# Patient Record
Sex: Female | Born: 1950 | Race: White | Hispanic: No | Marital: Married | Smoking: Never smoker
Health system: Southern US, Community
[De-identification: ages and names within clinical notes are randomized; demographics above are authoritative.]

## PROBLEM LIST (undated history)

## (undated) DIAGNOSIS — K589 Irritable bowel syndrome without diarrhea: Secondary | ICD-10-CM

## (undated) DIAGNOSIS — C50919 Malignant neoplasm of unspecified site of unspecified female breast: Secondary | ICD-10-CM

## (undated) DIAGNOSIS — E7800 Pure hypercholesterolemia, unspecified: Secondary | ICD-10-CM

## (undated) DIAGNOSIS — J449 Chronic obstructive pulmonary disease, unspecified: Secondary | ICD-10-CM

---

## 2018-12-19 ENCOUNTER — Other Ambulatory Visit: Payer: Self-pay

## 2018-12-19 ENCOUNTER — Emergency Department: Payer: Medicare Other

## 2018-12-19 ENCOUNTER — Inpatient Hospital Stay
Admission: EM | Admit: 2018-12-19 | Discharge: 2018-12-21 | DRG: 066 | Disposition: A | Discharge status: Home / Self Care | Payer: Medicare Other | Attending: Internal Medicine | Admitting: Internal Medicine

## 2018-12-19 DIAGNOSIS — I700 Atherosclerosis of aorta: Secondary | ICD-10-CM | POA: Diagnosis present

## 2018-12-19 DIAGNOSIS — R42 Dizziness and giddiness: Secondary | ICD-10-CM

## 2018-12-19 DIAGNOSIS — Z7951 Long term (current) use of inhaled steroids: Secondary | ICD-10-CM

## 2018-12-19 DIAGNOSIS — E785 Hyperlipidemia, unspecified: Secondary | ICD-10-CM | POA: Diagnosis present

## 2018-12-19 DIAGNOSIS — I10 Essential (primary) hypertension: Secondary | ICD-10-CM | POA: Diagnosis present

## 2018-12-19 DIAGNOSIS — Z79899 Other long term (current) drug therapy: Secondary | ICD-10-CM

## 2018-12-19 DIAGNOSIS — I639 Cerebral infarction, unspecified: Secondary | ICD-10-CM | POA: Diagnosis not present

## 2018-12-19 DIAGNOSIS — E7800 Pure hypercholesterolemia, unspecified: Secondary | ICD-10-CM | POA: Diagnosis present

## 2018-12-19 DIAGNOSIS — R29700 NIHSS score 0: Secondary | ICD-10-CM | POA: Diagnosis present

## 2018-12-19 DIAGNOSIS — I63411 Cerebral infarction due to embolism of right middle cerebral artery: Principal | ICD-10-CM | POA: Diagnosis present

## 2018-12-19 DIAGNOSIS — Z823 Family history of stroke: Secondary | ICD-10-CM

## 2018-12-19 DIAGNOSIS — K589 Irritable bowel syndrome without diarrhea: Secondary | ICD-10-CM | POA: Diagnosis present

## 2018-12-19 DIAGNOSIS — J449 Chronic obstructive pulmonary disease, unspecified: Secondary | ICD-10-CM | POA: Diagnosis present

## 2018-12-19 HISTORY — DX: Chronic obstructive pulmonary disease, unspecified: J44.9

## 2018-12-19 HISTORY — DX: Irritable bowel syndrome, unspecified: K58.9

## 2018-12-19 HISTORY — DX: Pure hypercholesterolemia, unspecified: E78.00

## 2018-12-19 LAB — BASIC METABOLIC PANEL
Anion gap: 10 (ref 5–15)
BUN: 20 mg/dL (ref 8–23)
CALCIUM: 9.3 mg/dL (ref 8.9–10.3)
CO2: 24 mmol/L (ref 22–32)
Chloride: 105 mmol/L (ref 98–111)
Creatinine, Ser: 1.22 mg/dL — ABNORMAL HIGH (ref 0.44–1.00)
GFR calc Af Amer: 54 mL/min — ABNORMAL LOW (ref >60)
GFR, EST NON AFRICAN AMERICAN: 46 mL/min — AB (ref >60)
Glucose, Bld: 94 mg/dL (ref 70–99)
Potassium: 4 mmol/L (ref 3.5–5.1)
Sodium: 139 mmol/L (ref 135–145)

## 2018-12-19 LAB — HEMOGLOBIN A1C
Hgb A1c MFr Bld: 5.9 % — ABNORMAL HIGH (ref 4.8–5.6)
Mean Plasma Glucose: 122.63 mg/dL

## 2018-12-19 LAB — CBC
HCT: 41.2 % (ref 36.0–46.0)
Hemoglobin: 13.2 g/dL (ref 12.0–15.0)
MCH: 27.8 pg (ref 26.0–34.0)
MCHC: 32 g/dL (ref 30.0–36.0)
MCV: 86.9 fL (ref 80.0–100.0)
PLATELETS: 261 10*3/uL (ref 150–400)
RBC: 4.74 MIL/uL (ref 3.87–5.11)
RDW: 13.9 % (ref 11.5–15.5)
WBC: 9.9 10*3/uL (ref 4.0–10.5)
nRBC: 0 % (ref 0.0–0.2)

## 2018-12-19 LAB — LIPID PANEL
Cholesterol: 177 mg/dL (ref 0–200)
HDL: 66 mg/dL (ref >40)
LDL Cholesterol: 97 mg/dL (ref 0–99)
TRIGLYCERIDES: 71 mg/dL (ref <150)
Total CHOL/HDL Ratio: 2.7 RATIO
VLDL: 14 mg/dL (ref 0–40)

## 2018-12-19 MED ORDER — LORAZEPAM 2 MG/ML IJ SOLN
1.0000 mg | Freq: Once | INTRAMUSCULAR | Status: AC
  Administered 2018-12-20: 1 mg via INTRAVENOUS
  Filled 2018-12-19 (×2): qty 1

## 2018-12-19 MED ORDER — VITAMIN D 25 MCG (1000 UNIT) PO TABS
2000.0000 [IU] | ORAL_TABLET | Freq: Every day | ORAL | Status: DC
  Administered 2018-12-19 – 2018-12-21 (×3): 2000 [IU] via ORAL
  Filled 2018-12-19 (×3): qty 2

## 2018-12-19 MED ORDER — HEPARIN SODIUM (PORCINE) 5000 UNIT/ML IJ SOLN
5000.0000 [IU] | Freq: Three times a day (TID) | INTRAMUSCULAR | Status: DC
  Administered 2018-12-19 – 2018-12-20 (×4): 5000 [IU] via SUBCUTANEOUS
  Filled 2018-12-19 (×4): qty 1

## 2018-12-19 MED ORDER — ACETAMINOPHEN 325 MG PO TABS
650.0000 mg | ORAL_TABLET | ORAL | Status: DC | PRN
  Administered 2018-12-19 – 2018-12-20 (×4): 650 mg via ORAL
  Filled 2018-12-19 (×4): qty 2

## 2018-12-19 MED ORDER — ACETAMINOPHEN 160 MG/5ML PO SOLN
650.0000 mg | ORAL | Status: DC | PRN
  Filled 2018-12-19: qty 20.3

## 2018-12-19 MED ORDER — FLUTICASONE PROPIONATE 50 MCG/ACT NA SUSP
1.0000 | Freq: Every day | NASAL | Status: DC
  Administered 2018-12-19 – 2018-12-20 (×2): 2 via NASAL
  Administered 2018-12-21: 11:00:00 1 via NASAL
  Filled 2018-12-19: qty 16

## 2018-12-19 MED ORDER — SENNOSIDES-DOCUSATE SODIUM 8.6-50 MG PO TABS: 1.0000 | ORAL_TABLET | Freq: Every evening | ORAL | Status: DC | PRN

## 2018-12-19 MED ORDER — MONTELUKAST SODIUM 10 MG PO TABS
10.0000 mg | ORAL_TABLET | Freq: Every evening | ORAL | Status: DC
  Administered 2018-12-19 – 2018-12-20 (×2): 10 mg via ORAL
  Filled 2018-12-19 (×2): qty 1

## 2018-12-19 MED ORDER — CALCIUM CARBONATE ANTACID 500 MG PO CHEW
600.0000 mg | CHEWABLE_TABLET | Freq: Every day | ORAL | Status: DC
  Administered 2018-12-20 – 2018-12-21 (×2): 600 mg via ORAL
  Filled 2018-12-19 (×2): qty 3

## 2018-12-19 MED ORDER — SIMVASTATIN 20 MG PO TABS
20.0000 mg | ORAL_TABLET | Freq: Every evening | ORAL | Status: DC
  Administered 2018-12-19 – 2018-12-20 (×2): 20 mg via ORAL
  Filled 2018-12-19 (×2): qty 1

## 2018-12-19 MED ORDER — STROKE: EARLY STAGES OF RECOVERY BOOK
Freq: Once | Status: AC
  Administered 2018-12-20

## 2018-12-19 MED ORDER — PANTOPRAZOLE SODIUM 40 MG PO TBEC
40.0000 mg | DELAYED_RELEASE_TABLET | Freq: Every day | ORAL | Status: DC
  Administered 2018-12-20 – 2018-12-21 (×2): 40 mg via ORAL
  Filled 2018-12-19 (×2): qty 1

## 2018-12-19 MED ORDER — SODIUM CHLORIDE 0.9 % IV SOLN
INTRAVENOUS | Status: DC
  Administered 2018-12-20 – 2018-12-21 (×3): via INTRAVENOUS

## 2018-12-19 MED ORDER — MOMETASONE FURO-FORMOTEROL FUM 100-5 MCG/ACT IN AERO
2.0000 | INHALATION_SPRAY | Freq: Two times a day (BID) | RESPIRATORY_TRACT | Status: DC
  Administered 2018-12-19 – 2018-12-20 (×3): 2 via RESPIRATORY_TRACT
  Filled 2018-12-19: qty 8.8

## 2018-12-19 MED ORDER — ACETAMINOPHEN 650 MG RE SUPP: 650.0000 mg | RECTAL | Status: DC | PRN

## 2018-12-19 MED ORDER — ASPIRIN 81 MG PO CHEW
81.0000 mg | CHEWABLE_TABLET | Freq: Every day | ORAL | Status: DC
  Administered 2018-12-20 – 2018-12-21 (×2): 81 mg via ORAL
  Filled 2018-12-19 (×2): qty 1

## 2018-12-19 MED ORDER — TIOTROPIUM BROMIDE MONOHYDRATE 18 MCG IN CAPS
18.0000 ug | ORAL_CAPSULE | Freq: Every day | RESPIRATORY_TRACT | Status: DC
  Administered 2018-12-20: 18 ug via RESPIRATORY_TRACT
  Filled 2018-12-19: qty 5

## 2018-12-19 NOTE — ED Triage Notes (Signed)
To ER via POV c/o headache X 2 days that have continued throughout today. Pt was forgetful on how to get dressed yesterday, dizziness that continues today. Pt does not appear to be confused or have loss of balance at this time. Equal strength in all extremities. denies vision changes over last 2 days.

## 2018-12-19 NOTE — ED Notes (Signed)
Patient sitting up in bed eating food from outside bought in by hr family.

## 2018-12-19 NOTE — ED Notes (Signed)
Report given to to receiving nurse Catia RN

## 2018-12-19 NOTE — ED Notes (Signed)
Reports headache 2 days ago, has not resolved. Patients labs and ct done while waiting to be bought back to room. NIH scale 0, patient passed swallow eval. Vss. Po meds given as ordered. S/o at bedside. Call light within reach. Will continue to monitor.

## 2018-12-19 NOTE — Progress Notes (Signed)
Family Meeting Note  Advance Directive:yes  Today a meeting took place with the Patient and spouse.   The following clinical team members were present during this meeting:MD  The following were discussed:Patient's diagnosis: Stroke, Patient's progosis: Unable to determine and Goals for treatment: Full Code  Additional follow-up to be provided: Neurology  Time spent during discussion:20 minutes  Vaughan Basta, MD

## 2018-12-19 NOTE — H&P (Signed)
[facility] at [place] NAME: Megan Hancock    MR#:  [id number]  DATE OF BIRTH:  [birth year]  DATE OF ADMISSION:  12/19/2018  PRIMARY CARE PHYSICIAN: Patient, No Pcp Per   REQUESTING/REFERRING PHYSICIAN: Archie Balboa  CHIEF COMPLAINT:   Chief Complaint  Patient presents with  . Headache  . Dizziness    HISTORY OF PRESENT ILLNESS: Megan Hancock  is a 68 y.o. female with a known history of COPD, hypercholesterolemia-lives in [place] and comes to [place] to the second home every winter from November to March. Taking all her medications regularly.  For last 2 days she had headache with some dizziness and funny feeling or incoordination in her movements.  Yesterday she also had some vision problem for very short time but it recovered right away. Concerned with this she came to hospital today and noted to have acute stroke on CT scan of the head.  PAST MEDICAL HISTORY:   Past Medical History:  Diagnosis Date  . COPD (chronic obstructive pulmonary disease) ([place])   . Hypercholesteremia     PAST SURGICAL HISTORY: History reviewed. No pertinent surgical history.  SOCIAL HISTORY:  Social History   Tobacco Use  . Smoking status: Never Smoker  Substance Use Topics  . Alcohol use: Not Currently    FAMILY HISTORY:  Family History  Problem Relation Age of Onset  . Stroke Daughter     DRUG ALLERGIES: No Known Allergies  REVIEW OF SYSTEMS:   CONSTITUTIONAL: No fever, fatigue or weakness.  EYES: No blurred or double vision.  EARS, NOSE, AND THROAT: No tinnitus or ear pain.  RESPIRATORY: No cough, shortness of breath, wheezing or hemoptysis.  CARDIOVASCULAR: No chest pain, orthopnea, edema.  GASTROINTESTINAL: No nausea, vomiting, diarrhea or abdominal pain.  GENITOURINARY: No dysuria, hematuria.  ENDOCRINE: No polyuria, nocturia,  HEMATOLOGY: No anemia, easy bruising or bleeding SKIN: No rash or lesion. MUSCULOSKELETAL: No  joint pain or arthritis.   NEUROLOGIC: No tingling, numbness, weakness.  PSYCHIATRY: No anxiety or depression.   MEDICATIONS AT HOME:  Prior to Admission medications   Medication Sig Start Date End Date Taking? Authorizing Provider  calcium carbonate (OSCAL) 1500 (600 Ca) MG TABS tablet Take 600 mg of elemental calcium by mouth daily.   Yes [provider]  Cholecalciferol (VITAMIN D) 50 MCG (2000 UT) tablet Take 2,000 Units by mouth daily.   Yes [provider]  Fiber POWD Take 5 g by mouth daily.   Yes [provider]  fluticasone (FLONASE) 50 MCG/ACT nasal spray Place 1-2 sprays into both nostrils daily.   Yes [provider]  mometasone (NASONEX) 50 MCG/ACT nasal spray Place 2 sprays into the nose daily as needed (nasal congestion).   Yes [provider]  mometasone-formoterol (DULERA) 100-5 MCG/ACT AERO Inhale 2 puffs into the lungs 2 (two) times daily.   Yes [provider]  montelukast (SINGULAIR) 10 MG tablet Take 10 mg by mouth every evening.   Yes [provider]  omeprazole (PRILOSEC) 40 MG capsule Take 40 mg by mouth daily.   Yes [provider]  simvastatin (ZOCOR) 20 MG tablet Take 20 mg by mouth every evening.   Yes [provider]  tiotropium (SPIRIVA) 18 MCG inhalation capsule Place 18 mcg into inhaler and inhale daily.   Yes [provider]      PHYSICAL EXAMINATION:   VITAL SIGNS: Blood pressure (!) 150/93, pulse 89, temperature 98 F (36.7 C), temperature source Oral,  resp. rate 14, height 5\' 8"  (1.727 m), weight 82.6 kg, SpO2 99 %.  GENERAL:  68 y.o.-year-old patient lying in the bed with no acute distress.  EYES: Pupils equal, round, reactive to light and accommodation. No scleral icterus. Extraocular muscles intact.  HEENT: Head atraumatic, normocephalic. Oropharynx and nasopharynx clear.  NECK:  Supple, no jugular venous distention. No thyroid enlargement, no tenderness.   LUNGS: Normal breath sounds bilaterally, no wheezing, rales,rhonchi or crepitation. No use of accessory muscles of respiration.  CARDIOVASCULAR: S1, S2 normal. No murmurs, rubs, or gallops.  ABDOMEN: Soft, nontender, nondistended. Bowel sounds present. No organomegaly or mass.  EXTREMITIES: No pedal edema, cyanosis, or clubbing.  NEUROLOGIC: Cranial nerves II through XII are intact. Muscle strength 5/5 in all extremities. Sensation intact. Gait not checked.  PSYCHIATRIC: The patient is alert and oriented x 3.  SKIN: No obvious rash, lesion, or ulcer.   LABORATORY PANEL:   CBC Recent Labs  Lab 12/19/18 1441  WBC 9.9  HGB 13.2  HCT 41.2  PLT 261  MCV 86.9  MCH 27.8  MCHC 32.0  RDW 13.9   ------------------------------------------------------------------------------------------------------------------  Chemistries  Recent Labs  Lab 12/19/18 1441  NA 139  K 4.0  CL 105  CO2 24  GLUCOSE 94  BUN 20  CREATININE 1.22*  CALCIUM 9.3   ------------------------------------------------------------------------------------------------------------------ estimated creatinine clearance is 51.8 mL/min (A) (by C-G formula based on SCr of 1.22 mg/dL (H)). ------------------------------------------------------------------------------------------------------------------ No results for input(s): TSH, T4TOTAL, T3FREE, THYROIDAB in the last 72 hours.  Invalid input(s): FREET3   Coagulation profile No results for input(s): INR, PROTIME in the last 168 hours. ------------------------------------------------------------------------------------------------------------------- No results for input(s): DDIMER in the last 72 hours. -------------------------------------------------------------------------------------------------------------------  Cardiac Enzymes No results for input(s): CKMB, TROPONINI, MYOGLOBIN in the last 168 hours.  Invalid input(s):  CK ------------------------------------------------------------------------------------------------------------------ Invalid input(s): POCBNP  ---------------------------------------------------------------------------------------------------------------  Urinalysis No results found for: COLORURINE, APPEARANCEUR, LABSPEC, PHURINE, GLUCOSEU, HGBUR, BILIRUBINUR, KETONESUR, PROTEINUR, UROBILINOGEN, NITRITE, LEUKOCYTESUR   RADIOLOGY: Ct Head Wo Contrast  Result Date: 12/19/2018 CLINICAL DATA:  Right-sided headache for 2 days with word-finding difficulty. EXAM: CT HEAD WITHOUT CONTRAST TECHNIQUE: Contiguous axial images were obtained from the base of the skull through the vertex without intravenous contrast. COMPARISON:  None. FINDINGS: Brain: There is a small acute to subacute cortical and subcortical infarct in the right parietal lobe. No intracranial hemorrhage, mass, midline shift, or extra-axial fluid collection is identified. The ventricles are normal. Bilateral cerebral white matter hypodensities are nonspecific but compatible with mild chronic small vessel ischemic disease. Vascular: Mild calcified atherosclerosis at the skull base. No hyperdense vessel. Skull: Right retromastoid craniotomy. Sinuses/Orbits: Visualized paranasal sinuses and mastoid air cells are clear. Bilateral cataract extraction. Other: None. IMPRESSION: 1. Small acute to subacute right parietal infarct. 2. Mild chronic small vessel ischemic disease. Electronically Signed   By: Logan Bores M.D.   On: 12/19/2018 15:24    EKG: Orders placed or performed during the hospital encounter of 12/19/18  . ED EKG  . ED EKG  . EKG 12-Lead  . EKG 12-Lead    IMPRESSION AND PLAN:  *Acute stroke Admitted for stroke work-up now MRI and MRA of brain. Carotid Doppler studies, echocardiogram. Check hemoglobin A1c and lipid panel. Frequent neuro checks. Her symptoms started 2 days ago so not a candidate for any interventions. We  will get PT OT and neurology evaluation. She was already taking statin, I will add aspirin for now.  *Permissive hypertension Her blood pressure is slightly higher currently but I  would allow it in this range and if it persist then we may have to start on small dose hypertensive medication.  *COPD Currently no exacerbation symptoms, continue home inhalers.  *Hyperlipidemia Continue statin.  All the records are reviewed and case discussed with ED provider. Management plans discussed with the patient, family and they are in agreement.  CODE STATUS: Full code.  Patient's husband was present in the room during my visit.  TOTAL TIME TAKING CARE OF THIS PATIENT: 45 minutes.    Vaughan Basta M.D on 12/19/2018   Between 7am to 6pm - Pager - [phone]  After 6pm go to [email or web address] - password [email or web address] [facility]  Office  [phone]  CC: Primary care physician; Patient, No Pcp Per   Note: This dictation was prepared with Dragon dictation along with smaller phrase technology. Any transcriptional errors that result from this process are unintentional.

## 2018-12-19 NOTE — ED Provider Notes (Signed)
[facility] Emergency Department Provider Note   ____________________________________________   I have reviewed the triage vital signs and the nursing notes.   HISTORY  Chief Complaint Headache and Dizziness   History limited by: Not Limited   HPI Megan Hancock is a 68 y.o. female who presents to the emergency department today because of concern for headache, dizziness and discoordination. The patient first noticed her symptoms upon awakening two days ago. Since then the headache has been persistent. The patient has noticed that she has been dizzy since then. She has also noticed discoordination, for example when trying to dress herself. The patient denies any change in vision or speech. Denies any focal weakness but noticed some tingling in the right hand. Denies similar symptoms in the past. Denies any trauma to her head.    Per medical record review patient has a history of COPD, HLD  Past Medical History:  Diagnosis Date  . COPD (chronic obstructive pulmonary disease) ([place])   . Hypercholesteremia     There are no active problems to display for this patient.   History reviewed. No pertinent surgical history.  Prior to Admission medications   Not on File    Allergies Patient has no known allergies.  No family history on file.  Social History Social History   Tobacco Use  . Smoking status: Not on file  Substance Use Topics  . Alcohol use: Not Currently  . Drug use: Not on file    Review of Systems Constitutional: No fever/chills Eyes: No visual changes. ENT: No sore throat. Cardiovascular: Denies chest pain. Respiratory: Denies shortness of breath. Gastrointestinal: No abdominal pain.  No nausea, no vomiting.  No diarrhea.   Genitourinary: Negative for dysuria. Musculoskeletal: Negative for back pain. Skin: Negative for rash. Neurological: Positive for headache and  discoordination  ____________________________________________   PHYSICAL EXAM:  VITAL SIGNS: ED Triage Vitals  Enc Vitals Group     BP 12/19/18 1427 (!) 150/93     Pulse Rate 12/19/18 1427 89     Resp 12/19/18 1427 14     Temp 12/19/18 1427 98 F (36.7 C)     Temp Source 12/19/18 1427 Oral     SpO2 12/19/18 1427 99 %     Weight 12/19/18 1427 182 lb (82.6 kg)     Height 12/19/18 1427 5\' 8"  (1.727 m)     Head Circumference --      Peak Flow --      Pain Score 12/19/18 1432 6   Constitutional: Alert and oriented.  Eyes: Conjunctivae are normal.  ENT      Head: Normocephalic and atraumatic.      Nose: No congestion/rhinnorhea.      Mouth/Throat: Mucous membranes are moist.      Neck: No stridor. Hematological/Lymphatic/Immunilogical: No cervical lymphadenopathy. Cardiovascular: Normal rate, regular rhythm.  No murmurs, rubs, or gallops.  Respiratory: Normal respiratory effort without tachypnea nor retractions. Breath sounds are clear and equal bilaterally. No wheezes/rales/rhonchi. Gastrointestinal: Soft and non tender. No rebound. No guarding.  Genitourinary: Deferred Musculoskeletal: Normal range of motion in all extremities. No lower extremity edema. Neurologic:  Normal speech and language. No gross focal neurologic deficits are appreciated.  Skin:  Skin is warm, dry and intact. No rash noted. Psychiatric: Mood and affect are normal. Speech and behavior are normal. Patient exhibits appropriate insight and judgment.  ____________________________________________    LABS (pertinent positives/negatives)  CBC wbc 9.9, hgb 13.2, plt 261 BMP wnl except cr 1.22 ____________________________________________  EKG  I, Nance Pear, attending physician, personally viewed and interpreted this EKG  EKG Time: 1438 Rate: 78 Rhythm: normal sinus rhythm Axis: normal axis Intervals: qtc 401 QRS: narrow ST changes: no st elevation Impression: possible left atrial  enlargement otherwise normal ekg   ____________________________________________    RADIOLOGY  CT head Small acute, subacute right parietal infarct  ____________________________________________   PROCEDURES  Procedures  ____________________________________________   INITIAL IMPRESSION / ASSESSMENT AND PLAN / ED COURSE  Pertinent labs & imaging results that were available during my care of the patient were reviewed by me and considered in my medical decision making (see chart for details).   Patient presented to the emergency department today because of concerns for 2-day history of headache, dizziness and discoordination.  CT scan was concerning for acute versus subacute parietal infarct.  Patient will be admitted to the hospital service for further stroke work-up.  ____________________________________________   FINAL CLINICAL IMPRESSION(S) / ED DIAGNOSES  Final diagnoses:  Dizziness  Cerebrovascular accident (CVA), unspecified mechanism ([place])     Note: This dictation was prepared with Dragon dictation. Any transcriptional errors that result from this process are unintentional     Nance Pear, MD 12/19/18 1743

## 2018-12-19 NOTE — ED Notes (Signed)
First nurse note: pt referred to ED by [facility] for R-sided headache x2 days. Staff report pt has had periodic difficulty finding words x2 dyas and occasionally forgets how to do things she knows how to do (eg. Getting dressed, misplacing things often). Pt appears in NAD, ambulatory to triage with steady gait. No facial droop or slurred speech noted at stat desk.

## 2018-12-20 ENCOUNTER — Inpatient Hospital Stay: Payer: Medicare Other

## 2018-12-20 ENCOUNTER — Inpatient Hospital Stay
Admit: 2018-12-20 | Discharge: 2018-12-20 | Disposition: A | Discharge status: Home / Self Care | Payer: Medicare Other | Attending: Internal Medicine | Admitting: Internal Medicine

## 2018-12-20 DIAGNOSIS — I639 Cerebral infarction, unspecified: Secondary | ICD-10-CM

## 2018-12-20 LAB — URINALYSIS, COMPLETE (UACMP) WITH MICROSCOPIC
Bilirubin Urine: NEGATIVE
Glucose, UA: NEGATIVE mg/dL
Hgb urine dipstick: NEGATIVE
Ketones, ur: NEGATIVE mg/dL
Leukocytes,Ua: NEGATIVE
Nitrite: NEGATIVE
Protein, ur: NEGATIVE mg/dL
SPECIFIC GRAVITY, URINE: 1.006 (ref 1.005–1.030)
pH: 6 (ref 5.0–8.0)

## 2018-12-20 LAB — ECHOCARDIOGRAM COMPLETE
Height: 68 in
WEIGHTICAEL: 2962.98 [oz_av]

## 2018-12-20 NOTE — Plan of Care (Signed)

## 2018-12-20 NOTE — Progress Notes (Signed)
SLP Cancellation Note  Patient Details Name: Megan Hancock MRN: [id number] DOB: October 03, 1951   Cancelled treatment:       Reason Eval/Treat Not Completed: SLP screened, no needs identified, will sign off, Chart reviewed. Pt and husband interviewed at length. No apparent evidence of receptive/expressive aphasia, dysarthria, cognitive linguistic deficits at bedside per screen. Spontaneous speech is fluent, 100% intelligible and composed of grammatically complex sentences. Pt and husband deny any s/s aspiration with current Regular diet with thin liquids. No current SLP needs identified, SLP to sign off at this time, please re-consult with any future change in status requiring reassessment. Thank you for this consult.   Chevelle Durr, MA, CCC-SLP 12/20/2018, 10:59 AM

## 2018-12-20 NOTE — Consult Note (Addendum)
Referring Physician: Bettey Costa, MD    Chief Complaint: right sided headache, dizziness.  Left-sided weakness and numbness.  HPI: Megan Hancock is an 68 y.o. female with past medical history of COPD, IBS, and hyperlipidemia presenting to the ED on 12/19/2018 with complaints of headache, dizziness and left-sided weakness and numbness. She reports onset of symptoms since 12/17/2018, per patient's husband who provides collateral history patient woke up the morning of 12/17/2018 with difficulty coordinating her left arm.  Patient reports she was trying to put on a robe but could not get her left arm to "work normally".  Her symptoms resolved that day and she was able to go about her daily activities without any issues.  However the next day on 12/18/2018 she developed headache on her right side that progressively increased in frequency, intensity and severity the next day 12/19/2018. Headache was intermittent, moderate to severe in intensity, located predominantly on the right side with associated dizziness. Denied associated altered sensorium, speech abnormality, cranial nerve deficit, seizures, diplopia, nausea or vomiting, syncope or LOC.  Patient is originally from [place] so she decided to call her PCP there who advised her to go to urgent care for further evaluation. Due to symptoms concerning for stroke, urgent care sent her to the ED for further evaluation. On arrival to the ED, blood pressure 150/93 mm Hg and pulse rate 89 beats/min. There were no focal neurological deficits; she was alert and oriented x4, and did not demonstrate any memory deficits.  Initial NIH stroke scale 0.  A non-contrast head CT showed an acute/subacute infarction in the right parietal.  An MRI of the head confirmed right parietal and insular/MCA territory infarct with small bilateral occipital acute versus subacute infarct compatible with embolic phenomena; MRA of the head was unremarkable.  Patient was admitted for further  stroke work-up and management.  Date last known well: Date: 12/17/2018 Time last known well: Unable to determine tPA Given: No: outside time window   Past Medical History:  Diagnosis Date  . COPD (chronic obstructive pulmonary disease) ([place])   . Hypercholesteremia   . IBS (irritable bowel syndrome)     History reviewed. No pertinent surgical history.  Family History  Problem Relation Age of Onset  . Stroke Daughter    Social History:  reports that she has never smoked. She has never used smokeless tobacco. She reports previous alcohol use. No history on file for drug.  Allergies: No Known Allergies  Medications:  I have reviewed the patient's current medications. Prior to Admission:  Medications Prior to Admission  Medication Sig Dispense Refill Last Dose  . calcium carbonate (OSCAL) 1500 (600 Ca) MG TABS tablet Take 600 mg of elemental calcium by mouth daily.   12/19/2018 at 0800  . Cholecalciferol (VITAMIN D) 50 MCG (2000 UT) tablet Take 2,000 Units by mouth daily.   12/18/2018 at Unknown time  . Fiber POWD Take 5 g by mouth daily.   12/18/2018 at Unknown time  . fluticasone (FLONASE) 50 MCG/ACT nasal spray Place 1-2 sprays into both nostrils daily.   12/18/2018 at Unknown time  . mometasone (NASONEX) 50 MCG/ACT nasal spray Place 2 sprays into the nose daily as needed (nasal congestion).   Unknown at PRN  . mometasone-formoterol (DULERA) 100-5 MCG/ACT AERO Inhale 2 puffs into the lungs 2 (two) times daily.   12/19/2018 at 0800  . montelukast (SINGULAIR) 10 MG tablet Take 10 mg by mouth every evening.   12/18/2018 at 2000  . omeprazole (PRILOSEC) 40 MG  capsule Take 40 mg by mouth daily.   12/19/2018 at 0800  . simvastatin (ZOCOR) 20 MG tablet Take 20 mg by mouth every evening.   12/18/2018 at 2000  . tiotropium (SPIRIVA) 18 MCG inhalation capsule Place 18 mcg into inhaler and inhale daily.   12/19/2018 at 0800   Scheduled: . aspirin  81 mg Oral Daily  . calcium carbonate  600 mg of  elemental calcium Oral Daily  . cholecalciferol  2,000 Units Oral Daily  . fluticasone  1-2 spray Each Nare Daily  . heparin  5,000 Units Subcutaneous Q8H  . mometasone-formoterol  2 puff Inhalation BID  . montelukast  10 mg Oral QPM  . pantoprazole  40 mg Oral Daily  . simvastatin  20 mg Oral QPM  . tiotropium  18 mcg Inhalation Daily    ROS: History obtained from the patient   General ROS: negative for - chills, fatigue, fever, night sweats, weight gain or weight loss Psychological ROS: negative for - behavioral disorder, hallucinations, memory difficulties, mood swings or suicidal ideation Ophthalmic ROS: negative for - blurry vision, double vision, eye pain or loss of vision ENT ROS: negative for - epistaxis, nasal discharge, oral lesions, sore throat, tinnitus or vertigo Allergy and Immunology ROS: negative for - hives or itchy/watery eyes Hematological and Lymphatic ROS: negative for - bleeding problems, bruising or swollen lymph nodes Endocrine ROS: negative for - galactorrhea, hair pattern changes, polydipsia/polyuria or temperature intolerance Respiratory ROS: negative for - cough, hemoptysis, shortness of breath or wheezing Cardiovascular ROS: negative for - chest pain, dyspnea on exertion, edema or irregular heartbeat Gastrointestinal ROS: negative for - abdominal pain, diarrhea, hematemesis, nausea/vomiting or stool incontinence Genito-Urinary ROS: negative for - dysuria, hematuria, incontinence or urinary frequency/urgency Musculoskeletal ROS: negative for - joint swelling or muscular weakness Neurological ROS: as noted in HPI Dermatological ROS: negative for rash and skin lesion changes  Physical Examination: Blood pressure (!) 152/79, pulse 98, temperature (!) 97.4 F (36.3 C), temperature source Oral, resp. rate 18, height 5\' 8"  (1.727 m), weight 84 kg, SpO2 98 %.   HEENT-  Normocephalic, no lesions, without obvious abnormality.  Normal external eye and conjunctiva.   Normal TM's bilaterally.  Normal auditory canals and external ears. Normal external nose, mucus membranes and septum.  Normal pharynx. Cardiovascular- S1, S2 normal, pulses palpable throughout   Lungs- chest clear, no wheezing, rales, normal symmetric air entry Abdomen- soft, non-tender; bowel sounds normal; no masses,  no organomegaly Extremities- no edema Lymph-no adenopathy palpable Musculoskeletal-no joint tenderness, deformity or swelling Skin-warm and dry, no hyperpigmentation, vitiligo, or suspicious lesions  Neurological Exam   Mental Status: Alert, oriented, thought content appropriate.  Speech fluent without evidence of aphasia.  Able to follow 3 step commands without difficulty. Attention span and concentration seemed appropriate  Cranial Nerves: II: Discs flat bilaterally; Visual fields grossly normal, pupils equal, round, reactive to light and accommodation III,IV, VI: ptosis present in the left, extra-ocular motions intact bilaterally V,VII: smile symmetric, facial light touch sensation decreased in the left VIII: hearing normal bilaterally IX,X: gag reflex present XI: bilateral shoulder shrug XII: midline tongue extension Motor: Right :  Upper extremity   5/5 Without pronator drift      Left: Upper extremity   4+/5 without pronator drift Right:   Lower extremity   5/5  Left: Lower extremity   4+/5 Tone and bulk:normal tone throughout; no atrophy noted Sensory: Pinprick and light touch decreased in the left Deep Tendon Reflexes: 2+ and symmetric throughout Plantars: Right: downgoing                            Left: downgoing Cerebellar: Finger-to-nose testing intact bilaterally. Heel to shin testing normal bilaterally Gait: not tested due to safety concerns  Data Reviewed  Laboratory Studies:  Basic Metabolic Panel: Recent Labs  Lab 12/19/18 1441  NA 139  K 4.0  CL 105  CO2 24  GLUCOSE 94  BUN 20  CREATININE 1.22*   CALCIUM 9.3    Liver Function Tests: No results for input(s): AST, ALT, ALKPHOS, BILITOT, PROT, ALBUMIN in the last 168 hours. No results for input(s): LIPASE, AMYLASE in the last 168 hours. No results for input(s): AMMONIA in the last 168 hours.  CBC: Recent Labs  Lab 12/19/18 1441  WBC 9.9  HGB 13.2  HCT 41.2  MCV 86.9  PLT 261    Cardiac Enzymes: No results for input(s): CKTOTAL, CKMB, CKMBINDEX, TROPONINI in the last 168 hours.  BNP: Invalid input(s): POCBNP  CBG: No results for input(s): GLUCAP in the last 168 hours.  Microbiology: No results found for this or any previous visit.  Coagulation Studies: No results for input(s): LABPROT, INR in the last 72 hours.  Urinalysis:  Recent Labs  Lab 12/20/18 0828  COLORURINE STRAW*  LABSPEC 1.006  PHURINE 6.0  GLUCOSEU NEGATIVE  HGBUR NEGATIVE  BILIRUBINUR NEGATIVE  KETONESUR NEGATIVE  PROTEINUR NEGATIVE  NITRITE NEGATIVE  LEUKOCYTESUR NEGATIVE    Lipid Panel:    Component Value Date/Time   CHOL 177 12/19/2018 1441   TRIG 71 12/19/2018 1441   HDL 66 12/19/2018 1441   CHOLHDL 2.7 12/19/2018 1441   VLDL 14 12/19/2018 1441   LDLCALC 97 12/19/2018 1441    HgbA1C:  Lab Results  Component Value Date   HGBA1C 5.9 (H) 12/19/2018    Urine Drug Screen:  No results found for: LABOPIA, COCAINSCRNUR, LABBENZ, AMPHETMU, THCU, LABBARB  Alcohol Level: No results for input(s): ETH in the last 168 hours.  Other results: EKG: normal EKG, normal sinus rhythm, unchanged from previous tracings. Vent. rate 78 BPM PR interval 128 ms QRS duration 62 ms QT/QTc 352/401 ms P-R-T axes 55 17 49  Imaging: Ct Head Wo Contrast  Result Date: 12/19/2018 CLINICAL DATA:  Right-sided headache for 2 days with word-finding difficulty. EXAM: CT HEAD WITHOUT CONTRAST TECHNIQUE: Contiguous axial images were obtained from the base of the skull through the vertex without intravenous contrast. COMPARISON:  None. FINDINGS: Brain:  There is a small acute to subacute cortical and subcortical infarct in the right parietal lobe. No intracranial hemorrhage, mass, midline shift, or extra-axial fluid collection is identified. The ventricles are normal. Bilateral cerebral white matter hypodensities are nonspecific but compatible with mild chronic small vessel ischemic disease. Vascular: Mild calcified atherosclerosis at the skull base. No hyperdense vessel. Skull: Right retromastoid craniotomy. Sinuses/Orbits: Visualized paranasal sinuses and mastoid air cells are clear. Bilateral cataract extraction. Other: None. IMPRESSION: 1. Small acute to subacute right parietal infarct. 2. Mild chronic small vessel ischemic disease. Electronically Signed   By: Logan Bores M.D.   On: 12/19/2018 15:24   Mr Brain Wo Contrast  Result Date: 12/20/2018 CLINICAL DATA:  RIGHT-sided headache for 2 days with word-finding difficulty. Follow up stroke. History of hypercholesterolemia. EXAM: MRI HEAD  WITHOUT CONTRAST MRA HEAD WITHOUT CONTRAST TECHNIQUE: Multiplanar, multiecho pulse sequences of the brain and surrounding structures were obtained without intravenous contrast. Angiographic images of the head were obtained using MRA technique without contrast. COMPARISON:  CT HEAD December 19, 2018 FINDINGS: MRI HEAD FINDINGS INTRACRANIAL CONTENTS: Patchy to confluent RIGHT parietal and insula reduced diffusion, low ADC values through and FLAIR T2 hyperintense signal. Subcentimeter bilateral occipital foci reduced diffusion. No midline shift, mass effect or masses. No parenchymal brain volume loss for age. No abnormal extra-axial fluid collections. VASCULAR: Normal major intracranial vascular flow voids present at skull base. SKULL AND UPPER CERVICAL SPINE: No abnormal sellar expansion. No suspicious calvarial bone marrow signal. Craniocervical junction maintained. SINUSES/ORBITS: Trace LEFT mastoid effusion. Paranasal sinuses are well aerated. Included ocular globes and  orbital contents are non-suspicious. Bilateral ocular lens implants. OTHER: None. MRA HEAD FINDINGS-moderately motion degraded examination. ANTERIOR CIRCULATION: Normal flow related enhancement of the included cervical, petrous, cavernous and supraclinoid internal carotid arteries. Patent anterior communicating artery. Patent anterior and middle cerebral arteries. Focal signal loss bilateral M2 segments associated with motion and pulsation artifact. No large vessel occlusion, flow limiting stenosis, or aneurysm. POSTERIOR CIRCULATION: Codominant vertebral arteries. Vertebrobasilar arteries are patent, with normal flow related enhancement of the main branch vessels. Patent posterior cerebral arteries. Bilateral posterior communicating arteries present. No large vessel occlusion, flow limiting stenosis, or aneurysm. ANATOMIC VARIANTS: Supernumerary anterior cerebral artery. Source data and MIP images were reviewed. IMPRESSION: MRI head: 1. 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 Small bilateral occipital acute versus subacute infarcts; constellation of findings most compatible with embolic phenomena. 2. Otherwise negative MRI head. MRA head: 1. Moderate motion degraded examination. No emergent large vessel occlusion or flow-limiting stenosis. Electronically Signed   By: Elon Alas M.D.   On: 12/20/2018 02:24   US Carotid Bilateral (at [facility] And [facility] Only)  Result Date: 12/20/2018 CLINICAL DATA:  Acute ischemic stroke. History hyperlipidemia. Former smoker. EXAM: BILATERAL CAROTID DUPLEX ULTRASOUND TECHNIQUE: Pearline Cables scale imaging, color Doppler and duplex ultrasound were performed of bilateral carotid and vertebral arteries in the neck. COMPARISON:  None. FINDINGS: Criteria: Quantification of carotid stenosis is based on velocity parameters that correlate the residual internal carotid diameter with NASCET-based stenosis levels, using the diameter of the distal internal carotid lumen as the  denominator for stenosis measurement. The following velocity measurements were obtained: RIGHT ICA: 111/40 cm/sec CCA: 65/68 cm/sec SYSTOLIC ICA/CCA RATIO:  1.7 ECA: 75 cm/sec LEFT ICA: 93/41 cm/sec CCA: 12/75 cm/sec SYSTOLIC ICA/CCA RATIO:  1.4 ECA: 67 cm/sec RIGHT CAROTID ARTERY: There is a moderate amount of eccentric mixed echogenic plaque within the right carotid bulb (images 14 and 15), extending to involve the origin of the right internal carotid artery (image 25), not resulting in elevated peak systolic velocities within the interrogated course of the right internal carotid artery to suggest a hemodynamically significant stenosis. RIGHT VERTEBRAL ARTERY:  Antegrade flow LEFT CAROTID ARTERY: There is a minimal to moderate amount of eccentric echogenic plaque within the left carotid bulb (image 48), not resulting in elevated peak systolic velocities within the interrogated course of the left internal carotid artery to suggest a hemodynamically significant stenosis. LEFT VERTEBRAL ARTERY:  Antegrade Flow IMPRESSION: Minimal to moderate amount of bilateral atherosclerotic plaque, not resulting in elevated peak systolic velocities within either internal carotid artery to suggest a hemodynamically significant stenosis. Electronically Signed   By: Sandi Mariscal M.D.   On: 12/20/2018 09:44   Mr Jodene Nam Head/brain TZ Cm  Result Date: 12/20/2018  CLINICAL DATA:  RIGHT-sided headache for 2 days with word-finding difficulty. Follow up stroke. History of hypercholesterolemia. EXAM: MRI HEAD WITHOUT CONTRAST MRA HEAD WITHOUT CONTRAST TECHNIQUE: Multiplanar, multiecho pulse sequences of the brain and surrounding structures were obtained without intravenous contrast. Angiographic images of the head were obtained using MRA technique without contrast. COMPARISON:  CT HEAD December 19, 2018 FINDINGS: MRI HEAD FINDINGS INTRACRANIAL CONTENTS: Patchy to confluent RIGHT parietal and insula reduced diffusion, low ADC values through  and FLAIR T2 hyperintense signal. Subcentimeter bilateral occipital foci reduced diffusion. No midline shift, mass effect or masses. No parenchymal brain volume loss for age. No abnormal extra-axial fluid collections. VASCULAR: Normal major intracranial vascular flow voids present at skull base. SKULL AND UPPER CERVICAL SPINE: No abnormal sellar expansion. No suspicious calvarial bone marrow signal. Craniocervical junction maintained. SINUSES/ORBITS: Trace LEFT mastoid effusion. Paranasal sinuses are well aerated. Included ocular globes and orbital contents are non-suspicious. Bilateral ocular lens implants. OTHER: None. MRA HEAD FINDINGS-moderately motion degraded examination. ANTERIOR CIRCULATION: Normal flow related enhancement of the included cervical, petrous, cavernous and supraclinoid internal carotid arteries. Patent anterior communicating artery. Patent anterior and middle cerebral arteries. Focal signal loss bilateral M2 segments associated with motion and pulsation artifact. No large vessel occlusion, flow limiting stenosis, or aneurysm. POSTERIOR CIRCULATION: Codominant vertebral arteries. Vertebrobasilar arteries are patent, with normal flow related enhancement of the main branch vessels. Patent posterior cerebral arteries. Bilateral posterior communicating arteries present. No large vessel occlusion, flow limiting stenosis, or aneurysm. ANATOMIC VARIANTS: Supernumerary anterior cerebral artery. Source data and MIP images were reviewed. IMPRESSION: MRI head: 1. 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 Small bilateral occipital acute versus subacute infarcts; constellation of findings most compatible with embolic phenomena. 2. Otherwise negative MRI head. MRA head: 1. Moderate motion degraded examination. No emergent large vessel occlusion or flow-limiting stenosis. Electronically Signed   By: Elon Alas M.D.   On: 12/20/2018 02:24   Patient seen and examined.  Clinical course and  management discussed.  Necessary edits performed.  I agree with the above.  Assessment and plan of care developed and discussed below.   Assessment: 68 y.o. female  with past medical history of COPD, IBS, and hyperlipidemia presenting to the ED on 12/19/2018 with complaints of headache, dizziness and left-sided weakness and numbness.  MRI brain reviewed and shows right parietal and insular/MCA territory infarct with small bilateral occipital acute versus subacute infarct.  Etiology likely embolic.  MRA head reviewed and shows no large vessel occlusion.  Ultrasound carotid did not show hemodynamically significant stenosis.  Hemoglobin A1c 5.9, LDL 97.  Patient was not on any anticoagulation or antiplatelet prior to this event.  Stroke Risk Factors - family history and hyperlipidemia  Plan: 1. Continue Aspirin 81 mg/day  2. Statin with goal low density lipoprotein (LDL) <70 mg/dl. 3. PT consult, OT consult, Speech consult 4. Echocardiogram with bubble study pending if unremarkable would perform TEE.  If both unremarkable, patient would benefit from outpatient follow up for prolonged cardiac monitoring 5. NPO until RN stroke swallow screen 6. Telemetry monitoring 7. Frequent neuro checks  This patient was staffed with Dr. Magda Paganini, Doy Mince who personally evaluated patient, reviewed documentation and agreed with assessment and plan of care as above.  Rufina Falco, DNP, FNP-BC Board certified Nurse Practitioner Neurology Department  12/20/2018, 10:58 AM  Alexis Goodell, MD Neurology [phone]  12/20/2018  1:40 PM

## 2018-12-20 NOTE — Progress Notes (Signed)
OT Cancellation Note  Patient Details Name: Megan Hancock MRN: [id number] DOB: 1951-04-12   Cancelled Treatment:    Reason Eval/Treat Not Completed: Patient at procedure or test/ unavailable. Order received, chart reviewed. Pt out of room for testing. Will re-attempt OT evaluation at later date/time as pt is available and medically appropriate.  Jeni Salles, MPH, MS, OTR/L ascom [phone] 12/20/18, 9:32 AM

## 2018-12-20 NOTE — Evaluation (Signed)
Physical Therapy Evaluation Patient Details Name: Megan Hancock MRN: [id number] DOB: 10/27/1950 Today's Date: 12/20/2018   History of Present Illness  presented to ER secondary to reports of dizziness, vision changes and 'funny feeling/movement' of extremities; admitted for TIA/CVA work up.  MRI significant for R parietal and insular/MCA infarct, small bilat occipital acute/subacute infarcts, suggestive of embolic cause.  Pending ECHO and TEE as needed per notes.  Clinical Impression  Upon evaluation, patient alert and oriented; follows commands and demonstrates good effort with mobility tasks.  Bilat UE/LE strength and ROM grossly symmetrical and WFL - grossly 4+ to 5/5 throughout; no focal weakness, sensory deficit reported; no drift or significant coordination deficits noted.  Higher-level balance deficits appreciated throughout session, with patient relying heavily on LE step strategy vs UE assist for balance recovery.  Demonstrates ability to complete bed mobility indep; sit/stand, basic transfers and gait (180') without assist device, cga/min assist.  Mild sway/deviation with head turns, sudden start/stop/direction changes, cga/min assist for recovery. Would benefit from skilled PT to address above deficits and promote optimal return to PLOF; recommend discharge home with outpatient PT follow up as medically appropriate.    Follow Up Recommendations Outpatient PT    Equipment Recommendations       Recommendations for Other Services       Precautions / Restrictions Precautions Precautions: Fall Restrictions Weight Bearing Restrictions: No      Mobility  Bed Mobility Overal bed mobility: Modified Independent                Transfers Overall transfer level: Needs assistance   Transfers: Sit to/from Stand Sit to Stand: Min guard         General transfer comment: increased sway in A/P plane with initial transition to upright; self-corrects with increased  time  Ambulation/Gait Ambulation/Gait assistance: Min assist;Min guard Gait Distance (Feet): 180 Feet Assistive device: None       General Gait Details: reciprocal stepping pattern; mild inconsistency in foot placement at times, but self-corrects as needed. Increased sway, gait deviation with head turns, sudden start/stop/changes of direction, cga/min assist for optimal safety and balance correction.  Stairs            Wheelchair Mobility    Modified Rankin (Stroke Patients Only)       Balance Overall balance assessment: Needs assistance Sitting-balance support: No upper extremity supported;Feet supported Sitting balance-Leahy Scale: Good     Standing balance support: No upper extremity supported Standing balance-Leahy Scale: Fair   Single Leg Stance - Right Leg: 5 Single Leg Stance - Left Leg: 3 Tandem Stance - Right Leg: 0 Tandem Stance - Left Leg: 0 Rhomberg - Eyes Opened: 25 Rhomberg - Eyes Closed: 20   High Level Balance Comments: heavy use of LE step strategy vs UE assist for balance recovery             Pertinent Vitals/Pain Pain Assessment: No/denies pain    Home Living Family/patient expects to be discharged to:: Private residence Living Arrangements: Spouse/significant other Available Help at Discharge: Family Type of Home: House Home Access: Stairs to enter Entrance Stairs-Rails: Left Entrance Stairs-Number of Steps: 3 Home Layout: One level        Prior Function Level of Independence: Independent         Comments: Indep with ADLs, household and community mobilization without assist device; denies fall history. Enjoys traveling with husband.     Hand Dominance   Dominant Hand: Right    Extremity/Trunk Assessment  Upper Extremity Assessment Upper Extremity Assessment: Overall WFL for tasks assessed(grossly 4+ to 5/5 throughout; no focal weakness, sensory deficit reported; no drift or significant coordination deficits noted)     Lower Extremity Assessment Lower Extremity Assessment: Overall WFL for tasks assessed(grossly 4+ to 5/5 throughout; no focal weakness, sensory deficit reported; no drift or significant coordination deficits noted)       Communication   Communication: No difficulties  Cognition Arousal/Alertness: Awake/alert Behavior During Therapy: WFL for tasks assessed/performed Overall Cognitive Status: Within Functional Limits for tasks assessed                                        General Comments      Exercises Other Exercises Other Exercises: Patient denies continued visual changes, but does endorse mild headache, dizziness with dynamic/vestibular components.  General visual screen negative at this time.  will continue to assess and integrate accommodation strategies as appropriate.   Assessment/Plan    PT Assessment Patient needs continued PT services  PT Problem List Decreased strength;Decreased activity tolerance;Decreased balance;Decreased mobility;Decreased coordination       PT Treatment Interventions DME instruction;Gait training;Stair training;Functional mobility training;Therapeutic activities;Therapeutic exercise;Balance training;Neuromuscular re-education;Patient/family education    PT Goals (Current goals can be found in the Care Plan section)  Acute Rehab PT Goals Patient Stated Goal: to return home PT Goal Formulation: With patient Time For Goal Achievement: 01/03/19 Potential to Achieve Goals: Good    Frequency 7X/week   Barriers to discharge        Co-evaluation               AM-PAC PT "6 Clicks" Mobility  Outcome Measure Help needed turning from your back to your side while in a flat bed without using bedrails?: None Help needed moving from lying on your back to sitting on the side of a flat bed without using bedrails?: None Help needed moving to and from a bed to a chair (including a wheelchair)?: None Help needed standing up from a  chair using your arms (e.g., wheelchair or bedside chair)?: None Help needed to walk in hospital room?: A Little Help needed climbing 3-5 steps with a railing? : A Little 6 Click Score: 22    End of Session Equipment Utilized During Treatment: Gait belt Activity Tolerance: Patient tolerated treatment well Patient left: in bed;with call bell/phone within reach;with family/visitor present Nurse Communication: Mobility status PT Visit Diagnosis: Difficulty in walking, not elsewhere classified (R26.2)    Time: 2330-0762 PT Time Calculation (min) (ACUTE ONLY): 20 min   Charges:   PT Evaluation $PT Eval Moderate Complexity: 1 Mod PT Treatments $Therapeutic Activity: 8-22 mins        Niyla Marone H. Owens Shark, PT, DPT, NCS 12/20/18, 5:40 PM [phone]

## 2018-12-20 NOTE — Progress Notes (Signed)
*  PRELIMINARY RESULTS* Echocardiogram 2D Echocardiogram has been performed.  Megan Hancock 12/20/2018, 2:10 PM

## 2018-12-20 NOTE — Progress Notes (Signed)
PT Cancellation Note  Patient Details Name: Zavia Pullen MRN: [id number] DOB: Jun 27, 1951   Cancelled Treatment:    Reason Eval/Treat Not Completed: Medical issues which prohibited therapy(Consult received and chart reviewed.  Patient currently with SLP for assessment.  Will re-attempt at later time/date as medically appropriate and available.)   Jehieli Brassell H. Owens Shark, PT, DPT, NCS 12/20/18, 10:47 AM [phone]

## 2018-12-20 NOTE — Plan of Care (Signed)
  Problem: Ischemic Stroke/TIA Tissue Perfusion: Goal: Complications of ischemic stroke/TIA will be minimized Outcome: Progressing   

## 2018-12-20 NOTE — Progress Notes (Signed)
[facility] NAME: Megan Hancock    MR#:  [id number]  DATE OF BIRTH:  1951/03/23  SUBJECTIVE:   Patient presented with headache and dizziness along with some visual issues. She continues to have left-sided facial numbness and arm numbness Denies any visual issues  REVIEW OF SYSTEMS:    Review of Systems  Constitutional: Negative for fever, chills weight loss HENT: Negative for ear pain, nosebleeds, congestion, facial swelling, rhinorrhea, neck pain, neck stiffness and ear discharge.   Respiratory: Negative for cough, shortness of breath, wheezing  Cardiovascular: Negative for chest pain, palpitations and leg swelling.  Gastrointestinal: Negative for heartburn, abdominal pain, vomiting, diarrhea or consitpation Genitourinary: Negative for dysuria, urgency, frequency, hematuria Musculoskeletal: Negative for back pain or joint pain Neurological:++ Dizziness and numbness +HA Hematological: Does not bruise/bleed easily.  Psychiatric/Behavioral: Negative for hallucinations, confusion, dysphoric mood    Tolerating Diet: yes      DRUG ALLERGIES:  No Known Allergies  VITALS:  Blood pressure (!) 152/79, pulse 98, temperature (!) 97.4 F (36.3 C), temperature source Oral, resp. rate 18, height 5\' 8"  (1.727 m), weight 84 kg, SpO2 98 %.  PHYSICAL EXAMINATION:  Constitutional: Appears well-developed and well-nourished. No distress. HENT: Normocephalic. Marland Kitchen Oropharynx is clear and moist.  Eyes: Conjunctivae and EOM are normal. PERRLA, no scleral icterus.  Neck: Normal ROM. Neck supple. No JVD. No tracheal deviation. CVS: RRR, S1/S2 +, no murmurs, no gallops, no carotid bruit.  Pulmonary: Effort and breath sounds normal, no stridor, rhonchi, wheezes, rales.  Abdominal: Soft. BS +,  no distension, tenderness, rebound or guarding.  Musculoskeletal: Normal range of motion. No edema and no tenderness.  Neuro: Alert.  Left facial droop  noted very minimal with decreased sensation of the face and arms  skin: Skin is warm and dry. No rash noted. Psychiatric: Normal mood and affect.      LABORATORY PANEL:   CBC Recent Labs  Lab 12/19/18 1441  WBC 9.9  HGB 13.2  HCT 41.2  PLT 261   ------------------------------------------------------------------------------------------------------------------  Chemistries  Recent Labs  Lab 12/19/18 1441  NA 139  K 4.0  CL 105  CO2 24  GLUCOSE 94  BUN 20  CREATININE 1.22*  CALCIUM 9.3   ------------------------------------------------------------------------------------------------------------------  Cardiac Enzymes No results for input(s): TROPONINI in the last 168 hours. ------------------------------------------------------------------------------------------------------------------  RADIOLOGY:  Ct Head Wo Contrast  Result Date: 12/19/2018 CLINICAL DATA:  Right-sided headache for 2 days with word-finding difficulty. EXAM: CT HEAD WITHOUT CONTRAST TECHNIQUE: Contiguous axial images were obtained from the base of the skull through the vertex without intravenous contrast. COMPARISON:  None. FINDINGS: Brain: There is a small acute to subacute cortical and subcortical infarct in the right parietal lobe. No intracranial hemorrhage, mass, midline shift, or extra-axial fluid collection is identified. The ventricles are normal. Bilateral cerebral white matter hypodensities are nonspecific but compatible with mild chronic small vessel ischemic disease. Vascular: Mild calcified atherosclerosis at the skull base. No hyperdense vessel. Skull: Right retromastoid craniotomy. Sinuses/Orbits: Visualized paranasal sinuses and mastoid air cells are clear. Bilateral cataract extraction. Other: None. IMPRESSION: 1. Small acute to subacute right parietal infarct. 2. Mild chronic small vessel ischemic disease. Electronically Signed   By: Logan Bores M.D.   On: 12/19/2018 15:24   Mr Brain Wo  Contrast  Result Date: 12/20/2018 CLINICAL DATA:  RIGHT-sided headache for 2 days with word-finding difficulty. Follow up stroke. History of hypercholesterolemia. EXAM: MRI HEAD WITHOUT CONTRAST MRA HEAD WITHOUT CONTRAST  TECHNIQUE: Multiplanar, multiecho pulse sequences of the brain and surrounding structures were obtained without intravenous contrast. Angiographic images of the head were obtained using MRA technique without contrast. COMPARISON:  CT HEAD December 19, 2018 FINDINGS: MRI HEAD FINDINGS INTRACRANIAL CONTENTS: Patchy to confluent RIGHT parietal and insula reduced diffusion, low ADC values through and FLAIR T2 hyperintense signal. Subcentimeter bilateral occipital foci reduced diffusion. No midline shift, mass effect or masses. No parenchymal brain volume loss for age. No abnormal extra-axial fluid collections. VASCULAR: Normal major intracranial vascular flow voids present at skull base. SKULL AND UPPER CERVICAL SPINE: No abnormal sellar expansion. No suspicious calvarial bone marrow signal. Craniocervical junction maintained. SINUSES/ORBITS: Trace LEFT mastoid effusion. Paranasal sinuses are well aerated. Included ocular globes and orbital contents are non-suspicious. Bilateral ocular lens implants. OTHER: None. MRA HEAD FINDINGS-moderately motion degraded examination. ANTERIOR CIRCULATION: Normal flow related enhancement of the included cervical, petrous, cavernous and supraclinoid internal carotid arteries. Patent anterior communicating artery. Patent anterior and middle cerebral arteries. Focal signal loss bilateral M2 segments associated with motion and pulsation artifact. No large vessel occlusion, flow limiting stenosis, or aneurysm. POSTERIOR CIRCULATION: Codominant vertebral arteries. Vertebrobasilar arteries are patent, with normal flow related enhancement of the main branch vessels. Patent posterior cerebral arteries. Bilateral posterior communicating arteries present. No large vessel  occlusion, flow limiting stenosis, or aneurysm. ANATOMIC VARIANTS: Supernumerary anterior cerebral artery. Source data and MIP images were reviewed. IMPRESSION: MRI head: 1. 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 Small bilateral occipital acute versus subacute infarcts; constellation of findings most compatible with embolic phenomena. 2. Otherwise negative MRI head. MRA head: 1. Moderate motion degraded examination. No emergent large vessel occlusion or flow-limiting stenosis. Electronically Signed   By: Elon Alas M.D.   On: 12/20/2018 02:24   US Carotid Bilateral (at [facility] And [facility] Only)  Result Date: 12/20/2018 CLINICAL DATA:  Acute ischemic stroke. History hyperlipidemia. Former smoker. EXAM: BILATERAL CAROTID DUPLEX ULTRASOUND TECHNIQUE: Pearline Cables scale imaging, color Doppler and duplex ultrasound were performed of bilateral carotid and vertebral arteries in the neck. COMPARISON:  None. FINDINGS: Criteria: Quantification of carotid stenosis is based on velocity parameters that correlate the residual internal carotid diameter with NASCET-based stenosis levels, using the diameter of the distal internal carotid lumen as the denominator for stenosis measurement. The following velocity measurements were obtained: RIGHT ICA: 111/40 cm/sec CCA: 68/12 cm/sec SYSTOLIC ICA/CCA RATIO:  1.7 ECA: 75 cm/sec LEFT ICA: 93/41 cm/sec CCA: 75/17 cm/sec SYSTOLIC ICA/CCA RATIO:  1.4 ECA: 67 cm/sec RIGHT CAROTID ARTERY: There is a moderate amount of eccentric mixed echogenic plaque within the right carotid bulb (images 14 and 15), extending to involve the origin of the right internal carotid artery (image 25), not resulting in elevated peak systolic velocities within the interrogated course of the right internal carotid artery to suggest a hemodynamically significant stenosis. RIGHT VERTEBRAL ARTERY:  Antegrade flow LEFT CAROTID ARTERY: There is a minimal to moderate amount of eccentric echogenic plaque within  the left carotid bulb (image 48), not resulting in elevated peak systolic velocities within the interrogated course of the left internal carotid artery to suggest a hemodynamically significant stenosis. LEFT VERTEBRAL ARTERY:  Antegrade Flow IMPRESSION: Minimal to moderate amount of bilateral atherosclerotic plaque, not resulting in elevated peak systolic velocities within either internal carotid artery to suggest a hemodynamically significant stenosis. Electronically Signed   By: Sandi Mariscal M.D.   On: 12/20/2018 09:44   Mr Jodene Nam Head/brain GY Cm  Result Date: 12/20/2018 CLINICAL DATA:  RIGHT-sided headache for  2 days with word-finding difficulty. Follow up stroke. History of hypercholesterolemia. EXAM: MRI HEAD WITHOUT CONTRAST MRA HEAD WITHOUT CONTRAST TECHNIQUE: Multiplanar, multiecho pulse sequences of the brain and surrounding structures were obtained without intravenous contrast. Angiographic images of the head were obtained using MRA technique without contrast. COMPARISON:  CT HEAD December 19, 2018 FINDINGS: MRI HEAD FINDINGS INTRACRANIAL CONTENTS: Patchy to confluent RIGHT parietal and insula reduced diffusion, low ADC values through and FLAIR T2 hyperintense signal. Subcentimeter bilateral occipital foci reduced diffusion. No midline shift, mass effect or masses. No parenchymal brain volume loss for age. No abnormal extra-axial fluid collections. VASCULAR: Normal major intracranial vascular flow voids present at skull base. SKULL AND UPPER CERVICAL SPINE: No abnormal sellar expansion. No suspicious calvarial bone marrow signal. Craniocervical junction maintained. SINUSES/ORBITS: Trace LEFT mastoid effusion. Paranasal sinuses are well aerated. Included ocular globes and orbital contents are non-suspicious. Bilateral ocular lens implants. OTHER: None. MRA HEAD FINDINGS-moderately motion degraded examination. ANTERIOR CIRCULATION: Normal flow related enhancement of the included cervical, petrous, cavernous  and supraclinoid internal carotid arteries. Patent anterior communicating artery. Patent anterior and middle cerebral arteries. Focal signal loss bilateral M2 segments associated with motion and pulsation artifact. No large vessel occlusion, flow limiting stenosis, or aneurysm. POSTERIOR CIRCULATION: Codominant vertebral arteries. Vertebrobasilar arteries are patent, with normal flow related enhancement of the main branch vessels. Patent posterior cerebral arteries. Bilateral posterior communicating arteries present. No large vessel occlusion, flow limiting stenosis, or aneurysm. ANATOMIC VARIANTS: Supernumerary anterior cerebral artery. Source data and MIP images were reviewed. IMPRESSION: MRI head: 1. 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 Small bilateral occipital acute versus subacute infarcts; constellation of findings most compatible with embolic phenomena. 2. Otherwise negative MRI head. MRA head: 1. Moderate motion degraded examination. No emergent large vessel occlusion or flow-limiting stenosis. Electronically Signed   By: Elon Alas M.D.   On: 12/20/2018 02:24     ASSESSMENT AND PLAN:   68 year old female with a history of hyperlipidemia who presented with headache and dizziness.  1.  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 Small bilateral occipital acute versus subacute infarcts; constellation of findings most compatible with embolic phenomena:  Carotid Doppler showed no hemodynamically significant stenosis Echocardiogram pending Plan for TEE tomorrow to evaluate for underlying thrombus Follow-up on neurology consultation, PT, OT Continue aspirin and statin LDL 97 A1c 5.9  2. HLD: Continue statin GOAL LDL <70  3.  COPD without signs of exacerbation      Management plans discussed with the patient and husband and they are in agreement.  CODE STATUS: full  TOTAL TIME TAKING CARE OF THIS PATIENT: 33 minutes.     POSSIBLE D/C  tomorrow, DEPENDING ON CLINICAL CONDITION.   Allisen Pidgeon M.D on 12/20/2018 at 10:51 AM  Between 7am to 6pm - Pager - [phone] After 6pm go to [email or web address] - password [email or web address] [facility]  Office  [phone]  CC: Primary care physician; Patient, No Pcp Per  Note: This dictation was prepared with Dragon dictation along with smaller phrase technology. Any transcriptional errors that result from this process are unintentional.

## 2018-12-20 NOTE — Consult Note (Signed)
[facility] Cardiology  CARDIOLOGY CONSULT NOTE  Patient ID: Megan Hancock MRN: [id number] DOB/AGE: 1951/08/25 68 y.o.  Admit date: 12/19/2018 Referring Physician Anselm Jungling Primary Physician out of state Primary Cardiologist none per patient Reason for Consultation consideration for TEE, acute stroke  HPI: 68 year old female referred for evaluation for transesophageal echocardiogram in the setting of acute stroke. The patient has a history of COPD and hypercholesterolemia. She lives in [place], and comes to [place] to stay in her second home during the winter months. She reports a 2 day history of intermittent headache, forgetting how to perform certain ADL's, and poor coordination of extremities. Brain MRI was positive for an acute right parietal and insular/MCA infarct, small bilateral occipital acute versus subacute infacts; constellation of findings most compatible with embolic phenomena. Carotid ultrasound revealed minimal to moderate amount of bilateral plaque, not hemodynamically significant. ECG revealed normal sinus rhythm. Telemetry has revealed sinus rhythm. Surface 2D echocardiogram is negative for cardiac source of emboli. The patient denies a history of known atrial fibrillation/flutter, arrhythmia, prior stroke, or myocardial infarction. She denies a history of difficult intubation, and currently denies dysphagia.  Review of systems complete and found to be negative unless listed above     Past Medical History:  Diagnosis Date  . COPD (chronic obstructive pulmonary disease) ([place])   . Hypercholesteremia   . IBS (irritable bowel syndrome)     History reviewed. No pertinent surgical history.  Medications Prior to Admission  Medication Sig Dispense Refill Last Dose  . calcium carbonate (OSCAL) 1500 (600 Ca) MG TABS tablet Take 600 mg of elemental calcium by mouth daily.   12/19/2018 at 0800  . Cholecalciferol (VITAMIN D) 50 MCG (2000 UT) tablet Take 2,000 Units by mouth  daily.   12/18/2018 at Unknown time  . Fiber POWD Take 5 g by mouth daily.   12/18/2018 at Unknown time  . fluticasone (FLONASE) 50 MCG/ACT nasal spray Place 1-2 sprays into both nostrils daily.   12/18/2018 at Unknown time  . mometasone (NASONEX) 50 MCG/ACT nasal spray Place 2 sprays into the nose daily as needed (nasal congestion).   Unknown at PRN  . mometasone-formoterol (DULERA) 100-5 MCG/ACT AERO Inhale 2 puffs into the lungs 2 (two) times daily.   12/19/2018 at 0800  . montelukast (SINGULAIR) 10 MG tablet Take 10 mg by mouth every evening.   12/18/2018 at 2000  . omeprazole (PRILOSEC) 40 MG capsule Take 40 mg by mouth daily.   12/19/2018 at 0800  . simvastatin (ZOCOR) 20 MG tablet Take 20 mg by mouth every evening.   12/18/2018 at 2000  . tiotropium (SPIRIVA) 18 MCG inhalation capsule Place 18 mcg into inhaler and inhale daily.   12/19/2018 at 0800   Social History   Socioeconomic History  . Marital status: Married    Spouse name: Not on file  . Number of children: Not on file  . Years of education: Not on file  . Highest education level: Not on file  Occupational History  . Not on file  Social Needs  . Financial resource strain: Not on file  . Food insecurity:    Worry: Not on file    Inability: Not on file  . Transportation needs:    Medical: Not on file    Non-medical: Not on file  Tobacco Use  . Smoking status: Never Smoker  . Smokeless tobacco: Never Used  Substance and Sexual Activity  . Alcohol use: Not Currently  . Drug use: Not on file  . Sexual  activity: Not on file  Lifestyle  . Physical activity:    Days per week: Not on file    Minutes per session: Not on file  . Stress: Not on file  Relationships  . Social connections:    Talks on phone: Not on file    Gets together: Not on file    Attends religious service: Not on file    Active member of club or organization: Not on file    Attends meetings of clubs or organizations: Not on file    Relationship status:  Not on file  . Intimate partner violence:    Fear of current or ex partner: Not on file    Emotionally abused: Not on file    Physically abused: Not on file    Forced sexual activity: Not on file  Other Topics Concern  . Not on file  Social History Narrative  . Not on file    Family History  Problem Relation Age of Onset  . Stroke Daughter       Review of systems complete and found to be negative unless listed above      PHYSICAL EXAM  General: Well developed, well nourished, in no acute distress HEENT:  Normocephalic and atramatic Neck:  No JVD.  Lungs: normal effort of breathing on room air. Heart: HRRR . Normal S1 and S2 without gallops or murmurs.  Abdomen: nondistended Msk:  Back normal, slow, steady gait.  Extremities: No clubbing, cyanosis or edema.   Neuro: Alert and oriented X 3. No slurred speech or facial droop. Psych:  Good affect, responds appropriately  Labs:   Lab Results  Component Value Date   WBC 9.9 12/19/2018   HGB 13.2 12/19/2018   HCT 41.2 12/19/2018   MCV 86.9 12/19/2018   PLT 261 12/19/2018    Recent Labs  Lab 12/19/18 1441  NA 139  K 4.0  CL 105  CO2 24  BUN 20  CREATININE 1.22*  CALCIUM 9.3  GLUCOSE 94   No results found for: CKTOTAL, CKMB, CKMBINDEX, TROPONINI  Lab Results  Component Value Date   CHOL 177 12/19/2018   Lab Results  Component Value Date   HDL 66 12/19/2018   Lab Results  Component Value Date   LDLCALC 97 12/19/2018   Lab Results  Component Value Date   TRIG 71 12/19/2018   Lab Results  Component Value Date   CHOLHDL 2.7 12/19/2018   No results found for: LDLDIRECT    Radiology: Ct Head Wo Contrast  Result Date: 12/19/2018 CLINICAL DATA:  Right-sided headache for 2 days with word-finding difficulty. EXAM: CT HEAD WITHOUT CONTRAST TECHNIQUE: Contiguous axial images were obtained from the base of the skull through the vertex without intravenous contrast. COMPARISON:  None. FINDINGS: Brain: There  is a small acute to subacute cortical and subcortical infarct in the right parietal lobe. No intracranial hemorrhage, mass, midline shift, or extra-axial fluid collection is identified. The ventricles are normal. Bilateral cerebral white matter hypodensities are nonspecific but compatible with mild chronic small vessel ischemic disease. Vascular: Mild calcified atherosclerosis at the skull base. No hyperdense vessel. Skull: Right retromastoid craniotomy. Sinuses/Orbits: Visualized paranasal sinuses and mastoid air cells are clear. Bilateral cataract extraction. Other: None. IMPRESSION: 1. Small acute to subacute right parietal infarct. 2. Mild chronic small vessel ischemic disease. Electronically Signed   By: Logan Bores M.D.   On: 12/19/2018 15:24   Mr Brain Wo Contrast  Result Date: 12/20/2018 CLINICAL DATA:  RIGHT-sided headache for  2 days with word-finding difficulty. Follow up stroke. History of hypercholesterolemia. EXAM: MRI HEAD WITHOUT CONTRAST MRA HEAD WITHOUT CONTRAST TECHNIQUE: Multiplanar, multiecho pulse sequences of the brain and surrounding structures were obtained without intravenous contrast. Angiographic images of the head were obtained using MRA technique without contrast. COMPARISON:  CT HEAD December 19, 2018 FINDINGS: MRI HEAD FINDINGS INTRACRANIAL CONTENTS: Patchy to confluent RIGHT parietal and insula reduced diffusion, low ADC values through and FLAIR T2 hyperintense signal. Subcentimeter bilateral occipital foci reduced diffusion. No midline shift, mass effect or masses. No parenchymal brain volume loss for age. No abnormal extra-axial fluid collections. VASCULAR: Normal major intracranial vascular flow voids present at skull base. SKULL AND UPPER CERVICAL SPINE: No abnormal sellar expansion. No suspicious calvarial bone marrow signal. Craniocervical junction maintained. SINUSES/ORBITS: Trace LEFT mastoid effusion. Paranasal sinuses are well aerated. Included ocular globes and orbital  contents are non-suspicious. Bilateral ocular lens implants. OTHER: None. MRA HEAD FINDINGS-moderately motion degraded examination. ANTERIOR CIRCULATION: Normal flow related enhancement of the included cervical, petrous, cavernous and supraclinoid internal carotid arteries. Patent anterior communicating artery. Patent anterior and middle cerebral arteries. Focal signal loss bilateral M2 segments associated with motion and pulsation artifact. No large vessel occlusion, flow limiting stenosis, or aneurysm. POSTERIOR CIRCULATION: Codominant vertebral arteries. Vertebrobasilar arteries are patent, with normal flow related enhancement of the main branch vessels. Patent posterior cerebral arteries. Bilateral posterior communicating arteries present. No large vessel occlusion, flow limiting stenosis, or aneurysm. ANATOMIC VARIANTS: Supernumerary anterior cerebral artery. Source data and MIP images were reviewed. IMPRESSION: MRI head: 1. 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 Small bilateral occipital acute versus subacute infarcts; constellation of findings most compatible with embolic phenomena. 2. Otherwise negative MRI head. MRA head: 1. Moderate motion degraded examination. No emergent large vessel occlusion or flow-limiting stenosis. Electronically Signed   By: Elon Alas M.D.   On: 12/20/2018 02:24   US Carotid Bilateral (at [facility] And [facility] Only)  Result Date: 12/20/2018 CLINICAL DATA:  Acute ischemic stroke. History hyperlipidemia. Former smoker. EXAM: BILATERAL CAROTID DUPLEX ULTRASOUND TECHNIQUE: Pearline Cables scale imaging, color Doppler and duplex ultrasound were performed of bilateral carotid and vertebral arteries in the neck. COMPARISON:  None. FINDINGS: Criteria: Quantification of carotid stenosis is based on velocity parameters that correlate the residual internal carotid diameter with NASCET-based stenosis levels, using the diameter of the distal internal carotid lumen as the denominator for  stenosis measurement. The following velocity measurements were obtained: RIGHT ICA: 111/40 cm/sec CCA: 81/44 cm/sec SYSTOLIC ICA/CCA RATIO:  1.7 ECA: 75 cm/sec LEFT ICA: 93/41 cm/sec CCA: 81/85 cm/sec SYSTOLIC ICA/CCA RATIO:  1.4 ECA: 67 cm/sec RIGHT CAROTID ARTERY: There is a moderate amount of eccentric mixed echogenic plaque within the right carotid bulb (images 14 and 15), extending to involve the origin of the right internal carotid artery (image 25), not resulting in elevated peak systolic velocities within the interrogated course of the right internal carotid artery to suggest a hemodynamically significant stenosis. RIGHT VERTEBRAL ARTERY:  Antegrade flow LEFT CAROTID ARTERY: There is a minimal to moderate amount of eccentric echogenic plaque within the left carotid bulb (image 48), not resulting in elevated peak systolic velocities within the interrogated course of the left internal carotid artery to suggest a hemodynamically significant stenosis. LEFT VERTEBRAL ARTERY:  Antegrade Flow IMPRESSION: Minimal to moderate amount of bilateral atherosclerotic plaque, not resulting in elevated peak systolic velocities within either internal carotid artery to suggest a hemodynamically significant stenosis. Electronically Signed   By: Eldridge Abrahams.D.  On: 12/20/2018 09:44   Mr Jodene Nam Head/brain YB Cm  Result Date: 12/20/2018 CLINICAL DATA:  RIGHT-sided headache for 2 days with word-finding difficulty. Follow up stroke. History of hypercholesterolemia. EXAM: MRI HEAD WITHOUT CONTRAST MRA HEAD WITHOUT CONTRAST TECHNIQUE: Multiplanar, multiecho pulse sequences of the brain and surrounding structures were obtained without intravenous contrast. Angiographic images of the head were obtained using MRA technique without contrast. COMPARISON:  CT HEAD December 19, 2018 FINDINGS: MRI HEAD FINDINGS INTRACRANIAL CONTENTS: Patchy to confluent RIGHT parietal and insula reduced diffusion, low ADC values through and FLAIR T2  hyperintense signal. Subcentimeter bilateral occipital foci reduced diffusion. No midline shift, mass effect or masses. No parenchymal brain volume loss for age. No abnormal extra-axial fluid collections. VASCULAR: Normal major intracranial vascular flow voids present at skull base. SKULL AND UPPER CERVICAL SPINE: No abnormal sellar expansion. No suspicious calvarial bone marrow signal. Craniocervical junction maintained. SINUSES/ORBITS: Trace LEFT mastoid effusion. Paranasal sinuses are well aerated. Included ocular globes and orbital contents are non-suspicious. Bilateral ocular lens implants. OTHER: None. MRA HEAD FINDINGS-moderately motion degraded examination. ANTERIOR CIRCULATION: Normal flow related enhancement of the included cervical, petrous, cavernous and supraclinoid internal carotid arteries. Patent anterior communicating artery. Patent anterior and middle cerebral arteries. Focal signal loss bilateral M2 segments associated with motion and pulsation artifact. No large vessel occlusion, flow limiting stenosis, or aneurysm. POSTERIOR CIRCULATION: Codominant vertebral arteries. Vertebrobasilar arteries are patent, with normal flow related enhancement of the main branch vessels. Patent posterior cerebral arteries. Bilateral posterior communicating arteries present. No large vessel occlusion, flow limiting stenosis, or aneurysm. ANATOMIC VARIANTS: Supernumerary anterior cerebral artery. Source data and MIP images were reviewed. IMPRESSION: MRI head: 1. 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 Small bilateral occipital acute versus subacute infarcts; constellation of findings most compatible with embolic phenomena. 2. Otherwise negative MRI head. MRA head: 1. Moderate motion degraded examination. No emergent large vessel occlusion or flow-limiting stenosis. Electronically Signed   By: Elon Alas M.D.   On: 12/20/2018 02:24    EKG: sinus rhythm  ASSESSMENT AND PLAN:  1. Acute right  parietal and insular/MCA infarct, small bilateral occipital acute versus subacute infacts; constellation of findings most compatible with embolic phenomena. Carotid ultrasound revealed minimal to moderate amount of bilateral plaque, not hemodynamically significant. ECG revealed normal sinus rhythm. Telemetry has revealed sinus rhythm. Surface 2D echocardiogram is negative for cardiac source of emboli.  2. Hyperlipidemia, on simvastatin  Recommendations: 1. Proceed with TEE tomorrow; NPO after midnight. The risks and benefits of TEE were discussed with the patient and her husband, and she elected to proceed. 2. Continue simvastatin    Signed: Clabe Seal PA-C 12/20/2018, 1:09 PM

## 2018-12-20 NOTE — Progress Notes (Signed)
OT Cancellation Note  Patient Details Name: Megan Hancock MRN: [id number] DOB: 1950/11/29   Cancelled Treatment:    Reason Eval/Treat Not Completed: OT screened, no needs identified, will sign off. Consult received, chart reviewed. Spouse present upon OT's arrival. Pt denies functional deficits. Just back from the bathroom prior to OT's arrival. Pt denies cognitive, speech, swallowing, fine motor coordination, strength, or visual deficits. Pt endorses feeling fatigued/"winded" after toileting task. O2 sats 96% on RA, HR 96. Pt endorses slightly decreased sensation on L side of face but no other sensory deficits. Pt able to perform ADL modified independently (taking additional time). No skilled OT services indicated. Will sign off. Please re-consult if additional needs arise.   Jeni Salles, MPH, MS, OTR/L ascom [phone] 12/20/18, 2:31 PM

## 2018-12-21 ENCOUNTER — Encounter
Admission: EM | Disposition: A | Discharge status: Home / Self Care | Payer: Self-pay | Source: Home / Self Care | Attending: Internal Medicine

## 2018-12-21 ENCOUNTER — Inpatient Hospital Stay
Admit: 2018-12-21 | Discharge: 2018-12-21 | Disposition: A | Discharge status: Home / Self Care | Payer: Medicare Other | Attending: Cardiology | Admitting: Cardiology

## 2018-12-21 HISTORY — PX: TEE WITHOUT CARDIOVERSION: SHX5443

## 2018-12-21 SURGERY — ECHOCARDIOGRAM, TRANSESOPHAGEAL
Anesthesia: Moderate Sedation

## 2018-12-21 MED ORDER — BUTAMBEN-TETRACAINE-BENZOCAINE 2-2-14 % EX AERO
INHALATION_SPRAY | CUTANEOUS | Status: AC
  Filled 2018-12-21: qty 5

## 2018-12-21 MED ORDER — FENTANYL CITRATE (PF) 100 MCG/2ML IJ SOLN
INTRAMUSCULAR | Status: AC
  Filled 2018-12-21: qty 2

## 2018-12-21 MED ORDER — ASPIRIN 81 MG PO CHEW: 81.0000 mg | CHEWABLE_TABLET | Freq: Every day | ORAL | Status: AC

## 2018-12-21 MED ORDER — SIMVASTATIN 40 MG PO TABS: 40.0000 mg | ORAL_TABLET | Freq: Every evening | ORAL | 0 refills | Status: AC

## 2018-12-21 MED ORDER — MIDAZOLAM HCL 5 MG/5ML IJ SOLN
INTRAMUSCULAR | Status: AC | PRN
  Administered 2018-12-21: 1 mg via INTRAVENOUS
  Administered 2018-12-21: 2 mg via INTRAVENOUS
  Administered 2018-12-21 (×2): 1 mg via INTRAVENOUS

## 2018-12-21 MED ORDER — SODIUM CHLORIDE 0.9 % IV SOLN: INTRAVENOUS | Status: DC

## 2018-12-21 MED ORDER — FENTANYL CITRATE (PF) 100 MCG/2ML IJ SOLN
INTRAMUSCULAR | Status: AC | PRN
  Administered 2018-12-21: 50 ug via INTRAVENOUS
  Administered 2018-12-21: 25 ug via INTRAVENOUS

## 2018-12-21 MED ORDER — MIDAZOLAM HCL 5 MG/5ML IJ SOLN
INTRAMUSCULAR | Status: AC
  Filled 2018-12-21: qty 5

## 2018-12-21 MED ORDER — SODIUM CHLORIDE FLUSH 0.9 % IV SOLN
INTRAVENOUS | Status: AC
  Administered 2018-12-21: 11:00:00 10 mL
  Filled 2018-12-21: qty 10

## 2018-12-21 MED ORDER — LIDOCAINE VISCOUS HCL 2 % MT SOLN
OROMUCOSAL | Status: AC
  Filled 2018-12-21: qty 15

## 2018-12-21 NOTE — Progress Notes (Signed)
TEE was negative for cardiac source of embolic.  The patient has remained in sinus rhythm on telemetry. The patient will need to be scheduled to be seen by Dr. Saralyn Pilar next week, at which time a 30-day event monitor will be placed for evaluation of subclinical atrial fibrillation in setting of acute stroke.

## 2018-12-21 NOTE — Progress Notes (Signed)
   12/21/18 1100  Clinical Encounter Type  Visited With Patient and family together  Visit Type Initial  Pt is going home. Ch was rounding.

## 2018-12-21 NOTE — Progress Notes (Signed)
PT Cancellation Note  Patient Details Name: Megan Hancock MRN: [id number] DOB: November 18, 1950   Cancelled Treatment:    Reason Eval/Treat Not Completed: Patient declined, no reason specified   Pt in bed, awaiting discharge.  Stated she has a SPC and walking stick at home if needed and had no further concerns regarding mobility.  Declined session this am.   Chesley Noon 12/21/2018, 11:45 AM

## 2018-12-21 NOTE — Discharge Summary (Signed)
[facility] NAME: Cheyla Duchemin    MR#:  [id number]  DATE OF BIRTH:  October 11, 1951  DATE OF ADMISSION:  12/19/2018 ADMITTING PHYSICIAN: Vaughan Basta, MD  DATE OF DISCHARGE: 12/21/2018  PRIMARY CARE PHYSICIAN: Patient, No Pcp Per    ADMISSION DIAGNOSIS:  Dizziness [R42] Cerebrovascular accident (CVA), unspecified mechanism ([place]) [I63.9]  DISCHARGE DIAGNOSIS:  Principal Problem:   Acute ischemic stroke ([place])   SECONDARY DIAGNOSIS:   Past Medical History:  Diagnosis Date  . COPD (chronic obstructive pulmonary disease) ([place])   . Hypercholesteremia   . IBS (irritable bowel syndrome)     HOSPITAL COURSE:  68 year old female with a history of hyperlipidemia who presented with headache and dizziness.  1.  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 Small bilateral occipital acute versus subacute infarcts; constellation of findings most compatible with embolic phenomena:  Carotid Doppler showed no hemodynamically significant stenosis Echocardiogram showed no thrombus.  She underwent TEE due to embolic nature of stroke which did not show signs of thrombus.  She will have 30-day event recorder placed.  I spoke with cardiologist and patient will follow-up with cardiology team for 30-day event recorder.  [place] in the [place] over [place] days.   Recommendations by neurology are to continue baby aspirin and statin. Her statin dose was increased to 40 mg daily.  Side effects, alternatives, risks and benefits were discussed with the patient.  LDL 97 A1c 5.9  2. HLD: Continue his dose of statin GOAL LDL <70  3.  COPD without signs of exacerbation   DISCHARGE CONDITIONS AND DIET:   stAble for discharge on regular heart healthy diet  CONSULTS OBTAINED:  Treatment Team:  Alexis Goodell, MD Isaias Cowman, MD  DRUG ALLERGIES:  No Known Allergies  DISCHARGE MEDICATIONS:   Allergies  as of 12/21/2018   No Known Allergies     Medication List    TAKE these medications   aspirin 81 MG chewable tablet Chew 1 tablet (81 mg total) by mouth daily.   calcium carbonate 1500 (600 Ca) MG Tabs tablet Commonly known as:  OSCAL Take 600 mg of elemental calcium by mouth daily.   Fiber Powd Take 5 g by mouth daily.   fluticasone 50 MCG/ACT nasal spray Commonly known as:  FLONASE Place 1-2 sprays into both nostrils daily.   mometasone 50 MCG/ACT nasal spray Commonly known as:  NASONEX Place 2 sprays into the nose daily as needed (nasal congestion).   mometasone-formoterol 100-5 MCG/ACT Aero Commonly known as:  DULERA Inhale 2 puffs into the lungs 2 (two) times daily.   montelukast 10 MG tablet Commonly known as:  SINGULAIR Take 10 mg by mouth every evening.   omeprazole 40 MG capsule Commonly known as:  PRILOSEC Take 40 mg by mouth daily.   simvastatin 40 MG tablet Commonly known as:  ZOCOR Take 1 tablet (40 mg total) by mouth every evening. What changed:    medication strength  how much to take   tiotropium 18 MCG inhalation capsule Commonly known as:  SPIRIVA Place 18 mcg into inhaler and inhale daily.   Vitamin D 50 MCG (2000 UT) tablet Take 2,000 Units by mouth daily.         Today   CHIEF COMPLAINT:   Doing well with resolved symptoms   VITAL SIGNS:  Blood pressure 117/75, pulse 77, temperature (!) 97.5 F (36.4 C), temperature source Oral, resp. rate 11, height 5\' 8"  (1.727  m), weight 84 kg, SpO2 93 %.   REVIEW OF SYSTEMS:  Review of Systems  Constitutional: Negative.  Negative for chills, fever and malaise/fatigue.  HENT: Negative.  Negative for ear discharge, ear pain, hearing loss, nosebleeds and sore throat.   Eyes: Negative.  Negative for blurred vision and pain.  Respiratory: Negative.  Negative for cough, hemoptysis, shortness of breath and wheezing.   Cardiovascular: Negative.  Negative for chest pain, palpitations and leg  swelling.  Gastrointestinal: Negative.  Negative for abdominal pain, blood in stool, diarrhea, nausea and vomiting.  Genitourinary: Negative.  Negative for dysuria.  Musculoskeletal: Negative.  Negative for back pain.  Skin: Negative.   Neurological: Negative for dizziness, tremors, speech change, focal weakness, seizures and headaches.  Endo/Heme/Allergies: Negative.  Does not bruise/bleed easily.  Psychiatric/Behavioral: Negative.  Negative for depression, hallucinations and suicidal ideas.     PHYSICAL EXAMINATION:  GENERAL:  68 y.o.-year-old patient lying in the bed with no acute distress.  NECK:  Supple, no jugular venous distention. No thyroid enlargement, no tenderness.  LUNGS: Normal breath sounds bilaterally, no wheezing, rales,rhonchi  No use of accessory muscles of respiration.  CARDIOVASCULAR: S1, S2 normal. No murmurs, rubs, or gallops.  ABDOMEN: Soft, non-tender, non-distended. Bowel sounds present. No organomegaly or mass.  EXTREMITIES: No pedal edema, cyanosis, or clubbing.  PSYCHIATRIC: The patient is alert and oriented x 3.  SKIN: No obvious rash, lesion, or ulcer.   DATA REVIEW:   CBC Recent Labs  Lab 12/19/18 1441  WBC 9.9  HGB 13.2  HCT 41.2  PLT 261    Chemistries  Recent Labs  Lab 12/19/18 1441  NA 139  K 4.0  CL 105  CO2 24  GLUCOSE 94  BUN 20  CREATININE 1.22*  CALCIUM 9.3    Cardiac Enzymes No results for input(s): TROPONINI in the last 168 hours.  Microbiology Results  @[id number] @  RADIOLOGY:  Ct Head Wo Contrast  Result Date: 12/19/2018 CLINICAL DATA:  Right-sided headache for 2 days with word-finding difficulty. EXAM: CT HEAD WITHOUT CONTRAST TECHNIQUE: Contiguous axial images were obtained from the base of the skull through the vertex without intravenous contrast. COMPARISON:  None. FINDINGS: Brain: There is a small acute to subacute cortical and subcortical infarct in the right parietal lobe. No intracranial hemorrhage, mass,  midline shift, or extra-axial fluid collection is identified. The ventricles are normal. Bilateral cerebral white matter hypodensities are nonspecific but compatible with mild chronic small vessel ischemic disease. Vascular: Mild calcified atherosclerosis at the skull base. No hyperdense vessel. Skull: Right retromastoid craniotomy. Sinuses/Orbits: Visualized paranasal sinuses and mastoid air cells are clear. Bilateral cataract extraction. Other: None. IMPRESSION: 1. Small acute to subacute right parietal infarct. 2. Mild chronic small vessel ischemic disease. Electronically Signed   By: Logan Bores M.D.   On: 12/19/2018 15:24   Mr Brain Wo Contrast  Result Date: 12/20/2018 CLINICAL DATA:  RIGHT-sided headache for 2 days with word-finding difficulty. Follow up stroke. History of hypercholesterolemia. EXAM: MRI HEAD WITHOUT CONTRAST MRA HEAD WITHOUT CONTRAST TECHNIQUE: Multiplanar, multiecho pulse sequences of the brain and surrounding structures were obtained without intravenous contrast. Angiographic images of the head were obtained using MRA technique without contrast. COMPARISON:  CT HEAD December 19, 2018 FINDINGS: MRI HEAD FINDINGS INTRACRANIAL CONTENTS: Patchy to confluent RIGHT parietal and insula reduced diffusion, low ADC values through and FLAIR T2 hyperintense signal. Subcentimeter bilateral occipital foci reduced diffusion. No midline shift, mass effect or masses. No parenchymal brain volume loss for age. No abnormal  extra-axial fluid collections. VASCULAR: Normal major intracranial vascular flow voids present at skull base. SKULL AND UPPER CERVICAL SPINE: No abnormal sellar expansion. No suspicious calvarial bone marrow signal. Craniocervical junction maintained. SINUSES/ORBITS: Trace LEFT mastoid effusion. Paranasal sinuses are well aerated. Included ocular globes and orbital contents are non-suspicious. Bilateral ocular lens implants. OTHER: None. MRA HEAD FINDINGS-moderately motion degraded  examination. ANTERIOR CIRCULATION: Normal flow related enhancement of the included cervical, petrous, cavernous and supraclinoid internal carotid arteries. Patent anterior communicating artery. Patent anterior and middle cerebral arteries. Focal signal loss bilateral M2 segments associated with motion and pulsation artifact. No large vessel occlusion, flow limiting stenosis, or aneurysm. POSTERIOR CIRCULATION: Codominant vertebral arteries. Vertebrobasilar arteries are patent, with normal flow related enhancement of the main branch vessels. Patent posterior cerebral arteries. Bilateral posterior communicating arteries present. No large vessel occlusion, flow limiting stenosis, or aneurysm. ANATOMIC VARIANTS: Supernumerary anterior cerebral artery. Source data and MIP images were reviewed. IMPRESSION: MRI head: 1. 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 Small bilateral occipital acute versus subacute infarcts; constellation of findings most compatible with embolic phenomena. 2. Otherwise negative MRI head. MRA head: 1. Moderate motion degraded examination. No emergent large vessel occlusion or flow-limiting stenosis. Electronically Signed   By: Elon Alas M.D.   On: 12/20/2018 02:24   US Carotid Bilateral (at [facility] Only)  Result Date: 12/20/2018 CLINICAL DATA:  Acute ischemic stroke. History hyperlipidemia. Former smoker. EXAM: BILATERAL CAROTID DUPLEX ULTRASOUND TECHNIQUE: Pearline Cables scale imaging, color Doppler and duplex ultrasound were performed of bilateral carotid and vertebral arteries in the neck. COMPARISON:  None. FINDINGS: Criteria: Quantification of carotid stenosis is based on velocity parameters that correlate the residual internal carotid diameter with NASCET-based stenosis levels, using the diameter of the distal internal carotid lumen as the denominator for stenosis measurement. The following velocity measurements were obtained: RIGHT ICA: 111/40 cm/sec CCA: 62/69 cm/sec  SYSTOLIC ICA/CCA RATIO:  1.7 ECA: 75 cm/sec LEFT ICA: 93/41 cm/sec CCA: 48/54 cm/sec SYSTOLIC ICA/CCA RATIO:  1.4 ECA: 67 cm/sec RIGHT CAROTID ARTERY: There is a moderate amount of eccentric mixed echogenic plaque within the right carotid bulb (images 14 and 15), extending to involve the origin of the right internal carotid artery (image 25), not resulting in elevated peak systolic velocities within the interrogated course of the right internal carotid artery to suggest a hemodynamically significant stenosis. RIGHT VERTEBRAL ARTERY:  Antegrade flow LEFT CAROTID ARTERY: There is a minimal to moderate amount of eccentric echogenic plaque within the left carotid bulb (image 48), not resulting in elevated peak systolic velocities within the interrogated course of the left internal carotid artery to suggest a hemodynamically significant stenosis. LEFT VERTEBRAL ARTERY:  Antegrade Flow IMPRESSION: Minimal to moderate amount of bilateral atherosclerotic plaque, not resulting in elevated peak systolic velocities within either internal carotid artery to suggest a hemodynamically significant stenosis. Electronically Signed   By: Sandi Mariscal M.D.   On: 12/20/2018 09:44   Mr Jodene Nam Head/brain OE Cm  Result Date: 12/20/2018 CLINICAL DATA:  RIGHT-sided headache for 2 days with word-finding difficulty. Follow up stroke. History of hypercholesterolemia. EXAM: MRI HEAD WITHOUT CONTRAST MRA HEAD WITHOUT CONTRAST TECHNIQUE: Multiplanar, multiecho pulse sequences of the brain and surrounding structures were obtained without intravenous contrast. Angiographic images of the head were obtained using MRA technique without contrast. COMPARISON:  CT HEAD December 19, 2018 FINDINGS: MRI HEAD FINDINGS INTRACRANIAL CONTENTS: Patchy to confluent RIGHT parietal and insula reduced diffusion, low ADC values through and FLAIR T2 hyperintense signal. Subcentimeter bilateral  occipital foci reduced diffusion. No midline shift, mass effect or masses.  No parenchymal brain volume loss for age. No abnormal extra-axial fluid collections. VASCULAR: Normal major intracranial vascular flow voids present at skull base. SKULL AND UPPER CERVICAL SPINE: No abnormal sellar expansion. No suspicious calvarial bone marrow signal. Craniocervical junction maintained. SINUSES/ORBITS: Trace LEFT mastoid effusion. Paranasal sinuses are well aerated. Included ocular globes and orbital contents are non-suspicious. Bilateral ocular lens implants. OTHER: None. MRA HEAD FINDINGS-moderately motion degraded examination. ANTERIOR CIRCULATION: Normal flow related enhancement of the included cervical, petrous, cavernous and supraclinoid internal carotid arteries. Patent anterior communicating artery. Patent anterior and middle cerebral arteries. Focal signal loss bilateral M2 segments associated with motion and pulsation artifact. No large vessel occlusion, flow limiting stenosis, or aneurysm. POSTERIOR CIRCULATION: Codominant vertebral arteries. Vertebrobasilar arteries are patent, with normal flow related enhancement of the main branch vessels. Patent posterior cerebral arteries. Bilateral posterior communicating arteries present. No large vessel occlusion, flow limiting stenosis, or aneurysm. ANATOMIC VARIANTS: Supernumerary anterior cerebral artery. Source data and MIP images were reviewed. IMPRESSION: MRI head: 1. 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 Small bilateral occipital acute versus subacute infarcts; constellation of findings most compatible with embolic phenomena. 2. Otherwise negative MRI head. MRA head: 1. Moderate motion degraded examination. No emergent large vessel occlusion or flow-limiting stenosis. Electronically Signed   By: Elon Alas M.D.   On: 12/20/2018 02:24      Allergies as of 12/21/2018   No Known Allergies     Medication List    TAKE these medications   aspirin 81 MG chewable tablet Chew 1 tablet (81 mg total) by mouth  daily.   calcium carbonate 1500 (600 Ca) MG Tabs tablet Commonly known as:  OSCAL Take 600 mg of elemental calcium by mouth daily.   Fiber Powd Take 5 g by mouth daily.   fluticasone 50 MCG/ACT nasal spray Commonly known as:  FLONASE Place 1-2 sprays into both nostrils daily.   mometasone 50 MCG/ACT nasal spray Commonly known as:  NASONEX Place 2 sprays into the nose daily as needed (nasal congestion).   mometasone-formoterol 100-5 MCG/ACT Aero Commonly known as:  DULERA Inhale 2 puffs into the lungs 2 (two) times daily.   montelukast 10 MG tablet Commonly known as:  SINGULAIR Take 10 mg by mouth every evening.   omeprazole 40 MG capsule Commonly known as:  PRILOSEC Take 40 mg by mouth daily.   simvastatin 40 MG tablet Commonly known as:  ZOCOR Take 1 tablet (40 mg total) by mouth every evening. What changed:    medication strength  how much to take   tiotropium 18 MCG inhalation capsule Commonly known as:  SPIRIVA Place 18 mcg into inhaler and inhale daily.   Vitamin D 50 MCG (2000 UT) tablet Take 2,000 Units by mouth daily.          Management plans discussed with the patient and she is in agreement. Stable for discharge home  Patient should follow up with pcp  CODE STATUS:     Code Status Orders  (From admission, onward)         Start     Ordered   12/19/18 2134  Full code  Continuous     12/19/18 2133        Code Status History    This patient has a current code status but no historical code status.      TOTAL TIME TAKING CARE OF THIS PATIENT: 38 minutes.  Note: This dictation was prepared with Dragon dictation along with smaller phrase technology. Any transcriptional errors that result from this process are unintentional.  Philomene Haff M.D on 12/21/2018 at 11:17 AM  Between 7am to 6pm - Pager - [phone] After 6pm go to [email or web address] - password [email or web address] [facility]  Office  [phone]  CC: Primary care  physician; Patient, No Pcp Per

## 2018-12-21 NOTE — Progress Notes (Signed)
*  PRELIMINARY RESULTS* Echocardiogram Echocardiogram Transesophageal has been performed.  Megan Hancock 12/21/2018, 8:57 AM

## 2018-12-21 NOTE — Care Management Note (Addendum)
Case Management Note  Patient Details  Name: Megan Hancock MRN: [id number] Date of Birth: 01/27/1951  Subjective/Objective:    Admitted to [facility] with the diagnosis of acute ischemic stroke. Lives with husband [name] [phone]). Primary care physician is Dr. Roxy Manns in [place]. Last seen by Dr, Roxy Manns in October 2019. Next appointment is May 4th 2020. Prescriptions are filled at [facility].  States that they live in [place] and visit [place] until From October to April 2020  No home health in the past. Takes care of all basic activities of daily living herself.  Action/Plan: Physical therapy is recommending outpatient therapy. Discussed this service with Mr & Ms Borntreger. Undecided at this time.  Information on [facility] and Dr. Sonia Baller given. Encouraged to establish a physician in [place].   Expected Discharge Date:  12/21/18               Expected Discharge Plan:     In-House Referral:   yes  Discharge planning Services     Post Acute Care Choice:    Choice offered to:     DME Arranged:    DME Agency:     HH Arranged:    HH Agency:     Status of Service:     If discussed at [employer], dates discussed:    Additional Comments:  Shelbie Ammons, RN MSN CCM Care Management [phone] 12/21/2018, 11:36 AM

## 2018-12-21 NOTE — CV Procedure (Signed)
   TRANSESOPHAGEAL ECHOCARDIOGRAM   NAME:  Megan Hancock   MRN: [id number] DOB:  09-Apr-1951   ADMIT DATE: 12/19/2018  INDICATIONS:   PROCEDURE:   Informed consent was obtained prior to the procedure. The risks, benefits and alternatives for the procedure were discussed and the patient comprehended these risks.  Risks include, but are not limited to, cough, sore throat, vomiting, nausea, somnolence, esophageal and stomach trauma or perforation, bleeding, low blood pressure, aspiration, pneumonia, infection, trauma to the teeth and death.    After a procedural time-out, the patient was given 5 mg versed and 75 mcg fentanyl to achieve moderate sedation for 20 min.  The oropharynx was anesthetized 3 cc of topical 1% viscous lidocaine.  The transesophageal probe was inserted in the esophagus and stomach without difficulty and multiple views were obtained. I was present for the entire procedure.    COMPLICATIONS:    There were no immediate complications.  FINDINGS:  LEFT VENTRICLE: EF = 55%. No regional wall motion abnormalities.  RIGHT VENTRICLE: Normal size and function.   LEFT ATRIUM: Normal size. No thrombus  LEFT ATRIAL APPENDAGE: No thrombus. Good doppler flow.  RIGHT ATRIUM: Normal size. No thrombus.   AORTIC VALVE:  Trileaflet. No thrombi or vegetations. Trivial AI  MITRAL VALVE:    Normal. No thrombi. Trivial to mild mr  TRICUSPID VALVE: Normal. Mild tr. No thrombi  PULMONIC VALVE: Grossly normal. Trivial PI.  INTERATRIAL SEPTUM: No PFO or ASD. Agitated saline contrast was used.   PERICARDIUM: No effusion  DESCENDING AORTA: Plaque in descending aorta  CONCLUSION: No cardiac source of emboli noted.

## 2018-12-21 NOTE — Progress Notes (Signed)
TEE completed.  No immediate complications No cardiac source of emboli noted.

## 2018-12-21 NOTE — Progress Notes (Signed)
Subjective: Patient has had no new neurological complaints.    Objective: Current vital signs: BP 117/75   Pulse 77   Temp (!) 97.5 F (36.4 C) (Oral)   Resp 11   Ht 5\' 8"  (1.727 m)   Wt 84 kg   SpO2 93%   BMI 28.16 kg/m  Vital signs in last 24 hours: Temp:  [97.5 F (36.4 C)-98.2 F (36.8 C)] 97.5 F (36.4 C) (02/28 0758) Pulse Rate:  [76-107] 77 (02/28 0930) Resp:  [11-23] 11 (02/28 0930) BP: (108-136)/(56-97) 117/75 (02/28 0930) SpO2:  [91 %-100 %] 93 % (02/28 0930)  Intake/Output from previous day: 02/27 0701 - 02/28 0700 In: 1286.9 [P.O.:240; I.V.:1046.9] Out: 500 [Urine:500] Intake/Output this shift: No intake/output data recorded. Nutritional status:  Diet Order            Diet regular Room service appropriate? Yes; Fluid consistency: Thin  Diet effective now              Neurologic Exam: Mental Status: Alert, oriented, thought content appropriate. Speech fluent without evidence of aphasia. Able to follow 3 step commands without difficulty. Attention span and concentration seemed appropriate  Cranial Nerves: II: Discs flat bilaterally; Visual fields grossly normal, pupils equal, round, reactive to light and accommodation III,IV, VI: ptosis present in the left, extra-ocular motions intact bilaterally V,VII: smile symmetric, facial light touch sensationdecreased in the left VIII: hearing normal bilaterally IX,X: gag reflex present XI: bilateral shoulder shrug XII: midline tongue extension Motor: Right :Upper extremity 5/5Without pronator driftLeft: Upper extremity 5-/5 without pronator drift Right:Lower extremity 5/5Left: Lower extremity 5-/5 Tone and bulk:normal tone throughout; no atrophy noted Sensory: Pinprick and light touchdecreased in the left Deep Tendon Reflexes: 2+ and symmetric throughout   Lab Results: Basic Metabolic Panel: Recent Labs  Lab 12/19/18 1441  NA 139  K 4.0   CL 105  CO2 24  GLUCOSE 94  BUN 20  CREATININE 1.22*  CALCIUM 9.3    Liver Function Tests: No results for input(s): AST, ALT, ALKPHOS, BILITOT, PROT, ALBUMIN in the last 168 hours. No results for input(s): LIPASE, AMYLASE in the last 168 hours. No results for input(s): AMMONIA in the last 168 hours.  CBC: Recent Labs  Lab 12/19/18 1441  WBC 9.9  HGB 13.2  HCT 41.2  MCV 86.9  PLT 261    Cardiac Enzymes: No results for input(s): CKTOTAL, CKMB, CKMBINDEX, TROPONINI in the last 168 hours.  Lipid Panel: Recent Labs  Lab 12/19/18 1441  CHOL 177  TRIG 71  HDL 66  CHOLHDL 2.7  VLDL 14  LDLCALC 97    CBG: No results for input(s): GLUCAP in the last 168 hours.  Microbiology: No results found for this or any previous visit.  Coagulation Studies: No results for input(s): LABPROT, INR in the last 72 hours.  Imaging: Ct Head Wo Contrast  Result Date: 12/19/2018 CLINICAL DATA:  Right-sided headache for 2 days with word-finding difficulty. EXAM: CT HEAD WITHOUT CONTRAST TECHNIQUE: Contiguous axial images were obtained from the base of the skull through the vertex without intravenous contrast. COMPARISON:  None. FINDINGS: Brain: There is a small acute to subacute cortical and subcortical infarct in the right parietal lobe. No intracranial hemorrhage, mass, midline shift, or extra-axial fluid collection is identified. The ventricles are normal. Bilateral cerebral white matter hypodensities are nonspecific but compatible with mild chronic small vessel ischemic disease. Vascular: Mild calcified atherosclerosis at the skull base. No hyperdense vessel. Skull: Right retromastoid craniotomy. Sinuses/Orbits: Visualized paranasal sinuses  and mastoid air cells are clear. Bilateral cataract extraction. Other: None. IMPRESSION: 1. Small acute to subacute right parietal infarct. 2. Mild chronic small vessel ischemic disease. Electronically Signed   By: Logan Bores M.D.   On: 12/19/2018 15:24    Mr Brain Wo Contrast  Result Date: 12/20/2018 CLINICAL DATA:  RIGHT-sided headache for 2 days with word-finding difficulty. Follow up stroke. History of hypercholesterolemia. EXAM: MRI HEAD WITHOUT CONTRAST MRA HEAD WITHOUT CONTRAST TECHNIQUE: Multiplanar, multiecho pulse sequences of the brain and surrounding structures were obtained without intravenous contrast. Angiographic images of the head were obtained using MRA technique without contrast. COMPARISON:  CT HEAD December 19, 2018 FINDINGS: MRI HEAD FINDINGS INTRACRANIAL CONTENTS: Patchy to confluent RIGHT parietal and insula reduced diffusion, low ADC values through and FLAIR T2 hyperintense signal. Subcentimeter bilateral occipital foci reduced diffusion. No midline shift, mass effect or masses. No parenchymal brain volume loss for age. No abnormal extra-axial fluid collections. VASCULAR: Normal major intracranial vascular flow voids present at skull base. SKULL AND UPPER CERVICAL SPINE: No abnormal sellar expansion. No suspicious calvarial bone marrow signal. Craniocervical junction maintained. SINUSES/ORBITS: Trace LEFT mastoid effusion. Paranasal sinuses are well aerated. Included ocular globes and orbital contents are non-suspicious. Bilateral ocular lens implants. OTHER: None. MRA HEAD FINDINGS-moderately motion degraded examination. ANTERIOR CIRCULATION: Normal flow related enhancement of the included cervical, petrous, cavernous and supraclinoid internal carotid arteries. Patent anterior communicating artery. Patent anterior and middle cerebral arteries. Focal signal loss bilateral M2 segments associated with motion and pulsation artifact. No large vessel occlusion, flow limiting stenosis, or aneurysm. POSTERIOR CIRCULATION: Codominant vertebral arteries. Vertebrobasilar arteries are patent, with normal flow related enhancement of the main branch vessels. Patent posterior cerebral arteries. Bilateral posterior communicating arteries present. No  large vessel occlusion, flow limiting stenosis, or aneurysm. ANATOMIC VARIANTS: Supernumerary anterior cerebral artery. Source data and MIP images were reviewed. IMPRESSION: MRI head: 1. 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 Small bilateral occipital acute versus subacute infarcts; constellation of findings most compatible with embolic phenomena. 2. Otherwise negative MRI head. MRA head: 1. Moderate motion degraded examination. No emergent large vessel occlusion or flow-limiting stenosis. Electronically Signed   By: Elon Alas M.D.   On: 12/20/2018 02:24   US Carotid Bilateral (at [facility] Only)  Result Date: 12/20/2018 CLINICAL DATA:  Acute ischemic stroke. History hyperlipidemia. Former smoker. EXAM: BILATERAL CAROTID DUPLEX ULTRASOUND TECHNIQUE: Pearline Cables scale imaging, color Doppler and duplex ultrasound were performed of bilateral carotid and vertebral arteries in the neck. COMPARISON:  None. FINDINGS: Criteria: Quantification of carotid stenosis is based on velocity parameters that correlate the residual internal carotid diameter with NASCET-based stenosis levels, using the diameter of the distal internal carotid lumen as the denominator for stenosis measurement. The following velocity measurements were obtained: RIGHT ICA: 111/40 cm/sec CCA: 96/29 cm/sec SYSTOLIC ICA/CCA RATIO:  1.7 ECA: 75 cm/sec LEFT ICA: 93/41 cm/sec CCA: 52/84 cm/sec SYSTOLIC ICA/CCA RATIO:  1.4 ECA: 67 cm/sec RIGHT CAROTID ARTERY: There is a moderate amount of eccentric mixed echogenic plaque within the right carotid bulb (images 14 and 15), extending to involve the origin of the right internal carotid artery (image 25), not resulting in elevated peak systolic velocities within the interrogated course of the right internal carotid artery to suggest a hemodynamically significant stenosis. RIGHT VERTEBRAL ARTERY:  Antegrade flow LEFT CAROTID ARTERY: There is a minimal to moderate amount of eccentric echogenic  plaque within the left carotid bulb (image 48), not resulting in elevated peak systolic velocities within the  interrogated course of the left internal carotid artery to suggest a hemodynamically significant stenosis. LEFT VERTEBRAL ARTERY:  Antegrade Flow IMPRESSION: Minimal to moderate amount of bilateral atherosclerotic plaque, not resulting in elevated peak systolic velocities within either internal carotid artery to suggest a hemodynamically significant stenosis. Electronically Signed   By: Sandi Mariscal M.D.   On: 12/20/2018 09:44   Mr Jodene Nam Head/brain CW Cm  Result Date: 12/20/2018 CLINICAL DATA:  RIGHT-sided headache for 2 days with word-finding difficulty. Follow up stroke. History of hypercholesterolemia. EXAM: MRI HEAD WITHOUT CONTRAST MRA HEAD WITHOUT CONTRAST TECHNIQUE: Multiplanar, multiecho pulse sequences of the brain and surrounding structures were obtained without intravenous contrast. Angiographic images of the head were obtained using MRA technique without contrast. COMPARISON:  CT HEAD December 19, 2018 FINDINGS: MRI HEAD FINDINGS INTRACRANIAL CONTENTS: Patchy to confluent RIGHT parietal and insula reduced diffusion, low ADC values through and FLAIR T2 hyperintense signal. Subcentimeter bilateral occipital foci reduced diffusion. No midline shift, mass effect or masses. No parenchymal brain volume loss for age. No abnormal extra-axial fluid collections. VASCULAR: Normal major intracranial vascular flow voids present at skull base. SKULL AND UPPER CERVICAL SPINE: No abnormal sellar expansion. No suspicious calvarial bone marrow signal. Craniocervical junction maintained. SINUSES/ORBITS: Trace LEFT mastoid effusion. Paranasal sinuses are well aerated. Included ocular globes and orbital contents are non-suspicious. Bilateral ocular lens implants. OTHER: None. MRA HEAD FINDINGS-moderately motion degraded examination. ANTERIOR CIRCULATION: Normal flow related enhancement of the included cervical,  petrous, cavernous and supraclinoid internal carotid arteries. Patent anterior communicating artery. Patent anterior and middle cerebral arteries. Focal signal loss bilateral M2 segments associated with motion and pulsation artifact. No large vessel occlusion, flow limiting stenosis, or aneurysm. POSTERIOR CIRCULATION: Codominant vertebral arteries. Vertebrobasilar arteries are patent, with normal flow related enhancement of the main branch vessels. Patent posterior cerebral arteries. Bilateral posterior communicating arteries present. No large vessel occlusion, flow limiting stenosis, or aneurysm. ANATOMIC VARIANTS: Supernumerary anterior cerebral artery. Source data and MIP images were reviewed. IMPRESSION: MRI head: 1. 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 Small bilateral occipital acute versus subacute infarcts; constellation of findings most compatible with embolic phenomena. 2. Otherwise negative MRI head. MRA head: 1. Moderate motion degraded examination. No emergent large vessel occlusion or flow-limiting stenosis. Electronically Signed   By: Elon Alas M.D.   On: 12/20/2018 02:24    Medications:  I have reviewed the patient's current medications. Scheduled: . aspirin  81 mg Oral Daily  . butamben-tetracaine-benzocaine      . calcium carbonate  600 mg of elemental calcium Oral Daily  . cholecalciferol  2,000 Units Oral Daily  . fentaNYL      . fluticasone  1-2 spray Each Nare Daily  . heparin  5,000 Units Subcutaneous Q8H  . lidocaine      . midazolam      . mometasone-formoterol  2 puff Inhalation BID  . montelukast  10 mg Oral QPM  . pantoprazole  40 mg Oral Daily  . simvastatin  20 mg Oral QPM  . tiotropium  18 mcg Inhalation Daily    Assessment/Plan: Patient with no neurological complaints.  On ASA and statin.  TEE unremarkable.    Recommendations: 1. Remain in ASA and statin 2. Patient to be seen on an outpatient basis for prolonged cardiac monitoring.    3. Follow up with neurology on an outpatient basis   LOS: 2 days   Alexis Goodell, MD Neurology [phone] 12/21/2018  12:14 PM

## 2018-12-21 NOTE — Discharge Planning (Signed)
Patient IV and tele removed.  RN assessment and VS revealed stability for DC to home.  Discharge papers given, explained and educated.  Informed of suggested FU appt and appt made.  Also informed to begin ASA 81 mg and change dosage of atorvastatin.  Discussed future s/sx of any future strokes and when to call 911.  Once ready, wheeled to front and family transporting home via car.

## 2018-12-22 ENCOUNTER — Encounter: Payer: Self-pay | Admitting: Cardiology

## 2019-01-01 ENCOUNTER — Encounter
Admission: RE | Disposition: A | Discharge status: Home / Self Care | Payer: Self-pay | Source: Home / Self Care | Attending: Cardiology

## 2019-01-01 ENCOUNTER — Encounter: Payer: Self-pay | Admitting: Cardiology

## 2019-01-01 ENCOUNTER — Other Ambulatory Visit: Payer: Self-pay

## 2019-01-01 ENCOUNTER — Ambulatory Visit
Admission: RE | Admit: 2019-01-01 | Discharge: 2019-01-01 | Disposition: A | Discharge status: Home / Self Care | Payer: Medicare Other | Attending: Cardiology | Admitting: Cardiology

## 2019-01-01 DIAGNOSIS — Z7982 Long term (current) use of aspirin: Secondary | ICD-10-CM | POA: Diagnosis not present

## 2019-01-01 DIAGNOSIS — Z8673 Personal history of transient ischemic attack (TIA), and cerebral infarction without residual deficits: Secondary | ICD-10-CM | POA: Diagnosis present

## 2019-01-01 DIAGNOSIS — J449 Chronic obstructive pulmonary disease, unspecified: Secondary | ICD-10-CM | POA: Diagnosis not present

## 2019-01-01 DIAGNOSIS — Z79899 Other long term (current) drug therapy: Secondary | ICD-10-CM | POA: Diagnosis not present

## 2019-01-01 DIAGNOSIS — Z7951 Long term (current) use of inhaled steroids: Secondary | ICD-10-CM | POA: Insufficient documentation

## 2019-01-01 DIAGNOSIS — E7800 Pure hypercholesterolemia, unspecified: Secondary | ICD-10-CM | POA: Insufficient documentation

## 2019-01-01 DIAGNOSIS — I639 Cerebral infarction, unspecified: Secondary | ICD-10-CM

## 2019-01-01 HISTORY — PX: LOOP RECORDER INSERTION: EP1214

## 2019-01-01 SURGERY — LOOP RECORDER INSERTION
Anesthesia: LOCAL

## 2019-01-01 MED ORDER — LIDOCAINE-EPINEPHRINE (PF) 1 %-1:200000 IJ SOLN
INTRAMUSCULAR | Status: AC
  Filled 2019-01-01: qty 30

## 2019-01-01 MED ORDER — LIDOCAINE-EPINEPHRINE (PF) 1 %-1:200000 IJ SOLN
INTRAMUSCULAR | Status: DC | PRN
  Administered 2019-01-01: 20 mL

## 2019-01-01 SURGICAL SUPPLY — 2 items
LOOP REVEAL LINQSYS (Prosthesis & Implant Heart) ×2 IMPLANT
PACK LOOP INSERTION (CUSTOM PROCEDURE TRAY) ×2 IMPLANT

## 2019-01-01 NOTE — Discharge Instructions (Signed)
Ambulatory Cardiac Monitoring An ambulatory cardiac monitor is a small recording device that is used to detect abnormal heart rhythms (arrhythmias). Most monitors are connected by wires to flat, sticky disks (electrodes) that are then attached to your chest. You may need to wear a monitor if you have had symptoms such as:  Fast heartbeats (palpitations).  Dizziness.  Fainting or light-headedness.  Unexplained weakness.  Shortness of breath. There are several types of monitors. Some common monitors include:  Holter monitor. This records your heart rhythm continuously, usually for 24-48 hours.  Event (episodic) monitor. This monitor has a symptoms button, and when pushed, it will begin recording. You need to activate this monitor to record when you have a heart-related symptom.  Automatic detection monitor. This monitor will begin recording when it detects an abnormal heartbeat. What are the risks? Generally, these devices are safe to use. However, it is possible that the skin under the electrodes will become irritated. How to prepare for monitoring Your health care provider will prepare your chest for the electrode placement and show you how to use the monitor.  Do not apply lotions to your chest before monitoring.  Follow directions on how to care for the monitor, and how to return the monitor when the testing period is complete. How to use your cardiac monitor  Follow directions about how long to wear the monitor, and if you can take the monitor off in order to shower or bathe. ? Do not let the monitor get wet. ? Do not bathe, swim, or use a hot tub while wearing the monitor.  Keep your skin clean. Do not put body lotion or moisturizer on your chest.  Change the electrodes as told by your health care provider, or any time they stop sticking to your skin. You may need to use medical tape to keep them on.  Try to put the electrodes in slightly different places on your chest to  help prevent skin irritation. Follow directions from your health care provider about where to place the electrodes.  Make sure the monitor is safely clipped to your clothing or in a location close to your body as recommended by your health care provider.  If your monitor has a symptoms button, press the button to mark an event as soon as you feel a heart-related symptom, such as: ? Dizziness. ? Weakness. ? Light-headedness. ? Palpitations. ? Thumping or pounding in your chest. ? Shortness of breath. ? Unexplained weakness.  Keep a diary of your activities, such as walking, doing chores, and taking medicine. It is very important to note what you were doing when you pushed the button to record your symptoms. This will help your health care provider determine what might be contributing to your symptoms.  Send the recorded information as recommended by your health care provider. It may take some time for your health care provider to process the results.  Change the batteries as told by your health care provider.  Keep electronic devices away from your monitor. These include: ? Tablets. ? MP3 players. ? Cell phones.  While wearing your monitor you should avoid: ? Electric blankets. ? Armed forces operational officer. ? Electric toothbrushes. ? Microwave ovens. ? Magnets. ? Metal detectors. Get help right away if:  You have chest pain.  You have shortness of breath or extreme difficulty breathing.  You develop a very fast heartbeat that does not get better.  You develop dizziness that does not go away.  You faint or constantly feel like  you are about to faint. Summary  An ambulatory cardiac monitor is a small recording device that is used to detect abnormal heart rhythms (arrhythmias).  Make sure you understand how to send the information from the monitor to your health care provider.  It is important to press the button on the monitor when you have any heart-related symptoms.  Keep a  diary of your activities, such as walking, doing chores, and taking medicine. It is very important to note what you were doing when you pushed the button to record your symptoms. This will help your health care provider learn what might be causing your symptoms. This information is not intended to replace advice given to you by your health care provider. Make sure you discuss any questions you have with your health care provider. Document Released: 07/19/2008 Document Revised: 07/26/2017 Document Reviewed: 09/24/2016 Elsevier Interactive Patient Education  2019 [place], Adult An incision is a surgical cut that is made through your skin. Most incisions are closed after surgery. Your incision may be closed with stitches (sutures), staples, skin glue, or adhesive strips. You may need to return to your health care provider to have sutures or staples removed. This may occur several days to several weeks after your surgery. The incision needs to be cared for properly to prevent infection. How to care for your incision Incision care   Follow instructions from your health care provider about how to take care of your incision. Make sure you: ? Wash your hands with soap and water before you change the bandage (dressing). If soap and water are not available, use hand sanitizer. ? Change your dressing as told by your health care provider. ? Leave sutures, skin glue, or adhesive strips in place. These skin closures may need to stay in place for 2 weeks or longer. If adhesive strip edges start to loosen and curl up, you may trim the loose edges. Do not remove adhesive strips completely unless your health care provider tells you to do that.  Check your incision area every day for signs of infection. Check for: ? More redness, swelling, or pain. ? More fluid or blood. ? Warmth. ? Pus or a bad smell.  Ask your health care provider how to clean the incision. This may include: ? Using mild  soap and water. ? Using a clean towel to pat the incision dry after cleaning it. ? Applying a cream or ointment. Do this only as told by your health care provider. ? Covering the incision with a clean dressing.  Ask your health care provider when you can leave the incision uncovered.  Do not take baths, swim, or use a hot tub until your health care provider approves. Ask your health care provider if you can take showers. You may only be allowed to take sponge baths for bathing. Medicines  If you were prescribed an antibiotic medicine, cream, or ointment, take or apply the antibiotic as told by your health care provider. Do not stop taking or applying the antibiotic even if your condition improves.  Take over-the-counter and prescription medicines only as told by your health care provider. General instructions  Limit movement around your incision to improve healing. ? Avoid straining, lifting, or exercise for the first month, or for as long as told by your health care provider. ? Follow instructions from your health care provider about returning to your normal activities. ? Ask your health care provider what activities are safe.  Protect your incision from  the sun when you are outside for the first 6 months, or for as long as told by your health care provider. Apply sunscreen around the scar or cover it up.  Keep all follow-up visits as told by your health care provider. This is important. Contact a health care provider if:  Your have more redness, swelling, or pain around the incision.  You have more fluid or blood coming from the incision.  Your incision feels warm to the touch.  You have pus or a bad smell coming from the incision.  You have a fever or shaking chills.  You are nauseous or you vomit.  You are dizzy.  Your sutures or staples come undone. Get help right away if:  You have a red streak coming from your incision.  Your incision bleeds through the dressing and  the bleeding does not stop with gentle pressure.  The edges of your incision open up and separate.  You have severe pain.  You have a rash.  You are confused.  You faint.  You have trouble breathing and a fast heartbeat. This information is not intended to replace advice given to you by your health care provider. Make sure you discuss any questions you have with your health care provider. Document Released: 04/29/2005 Document Revised: 06/17/2016 Document Reviewed: 04/27/2016 Elsevier Interactive Patient Education  2019 [employer].

## 2019-03-25 ENCOUNTER — Telehealth (HOSPITAL_COMMUNITY): Payer: Self-pay | Admitting: *Deleted

## 2019-03-25 ENCOUNTER — Emergency Department (HOSPITAL_COMMUNITY): Payer: Medicare Other

## 2019-03-25 ENCOUNTER — Inpatient Hospital Stay (HOSPITAL_COMMUNITY): Payer: Medicare Other

## 2019-03-25 ENCOUNTER — Inpatient Hospital Stay (HOSPITAL_COMMUNITY): Payer: Medicare Other | Admitting: Certified Registered Nurse Anesthetist

## 2019-03-25 ENCOUNTER — Emergency Department: Payer: Medicare Other

## 2019-03-25 ENCOUNTER — Encounter (HOSPITAL_COMMUNITY): Admission: EM | Disposition: E | Payer: Self-pay | Source: Home / Self Care | Attending: Neurology

## 2019-03-25 ENCOUNTER — Telehealth (HOSPITAL_COMMUNITY): Payer: Self-pay

## 2019-03-25 ENCOUNTER — Encounter: Payer: Self-pay | Admitting: Emergency Medicine

## 2019-03-25 ENCOUNTER — Inpatient Hospital Stay (HOSPITAL_COMMUNITY)
Admission: EM | Admit: 2019-03-25 | Discharge: 2019-04-24 | DRG: 023 | Disposition: E | Payer: Medicare Other | Attending: Neurology | Admitting: Neurology

## 2019-03-25 ENCOUNTER — Encounter (HOSPITAL_COMMUNITY): Payer: Self-pay | Admitting: Emergency Medicine

## 2019-03-25 ENCOUNTER — Other Ambulatory Visit: Payer: Self-pay

## 2019-03-25 ENCOUNTER — Emergency Department
Admission: EM | Admit: 2019-03-25 | Discharge: 2019-03-25 | Disposition: A | Discharge status: Other Acute Inpatient Hospital | Payer: Medicare Other | Attending: Emergency Medicine | Admitting: Emergency Medicine

## 2019-03-25 DIAGNOSIS — R0600 Dyspnea, unspecified: Secondary | ICD-10-CM

## 2019-03-25 DIAGNOSIS — I629 Nontraumatic intracranial hemorrhage, unspecified: Secondary | ICD-10-CM | POA: Diagnosis not present

## 2019-03-25 DIAGNOSIS — Z9911 Dependence on respirator [ventilator] status: Secondary | ICD-10-CM | POA: Diagnosis not present

## 2019-03-25 DIAGNOSIS — I959 Hypotension, unspecified: Secondary | ICD-10-CM | POA: Diagnosis not present

## 2019-03-25 DIAGNOSIS — C7989 Secondary malignant neoplasm of other specified sites: Secondary | ICD-10-CM

## 2019-03-25 DIAGNOSIS — I639 Cerebral infarction, unspecified: Secondary | ICD-10-CM | POA: Diagnosis present

## 2019-03-25 DIAGNOSIS — Z823 Family history of stroke: Secondary | ICD-10-CM | POA: Diagnosis not present

## 2019-03-25 DIAGNOSIS — E7800 Pure hypercholesterolemia, unspecified: Secondary | ICD-10-CM | POA: Diagnosis present

## 2019-03-25 DIAGNOSIS — Z171 Estrogen receptor negative status [ER-]: Secondary | ICD-10-CM | POA: Diagnosis not present

## 2019-03-25 DIAGNOSIS — R1310 Dysphagia, unspecified: Secondary | ICD-10-CM

## 2019-03-25 DIAGNOSIS — D72829 Elevated white blood cell count, unspecified: Secondary | ICD-10-CM | POA: Diagnosis not present

## 2019-03-25 DIAGNOSIS — Z7189 Other specified counseling: Secondary | ICD-10-CM | POA: Diagnosis not present

## 2019-03-25 DIAGNOSIS — E785 Hyperlipidemia, unspecified: Secondary | ICD-10-CM | POA: Diagnosis present

## 2019-03-25 DIAGNOSIS — I6602 Occlusion and stenosis of left middle cerebral artery: Secondary | ICD-10-CM | POA: Diagnosis not present

## 2019-03-25 DIAGNOSIS — R29714 NIHSS score 14: Secondary | ICD-10-CM | POA: Diagnosis present

## 2019-03-25 DIAGNOSIS — Z79899 Other long term (current) drug therapy: Secondary | ICD-10-CM | POA: Diagnosis not present

## 2019-03-25 DIAGNOSIS — R1312 Dysphagia, oropharyngeal phase: Secondary | ICD-10-CM | POA: Diagnosis not present

## 2019-03-25 DIAGNOSIS — R414 Neurologic neglect syndrome: Secondary | ICD-10-CM | POA: Diagnosis present

## 2019-03-25 DIAGNOSIS — Z978 Presence of other specified devices: Secondary | ICD-10-CM

## 2019-03-25 DIAGNOSIS — R591 Generalized enlarged lymph nodes: Secondary | ICD-10-CM | POA: Diagnosis present

## 2019-03-25 DIAGNOSIS — I82442 Acute embolism and thrombosis of left tibial vein: Secondary | ICD-10-CM | POA: Diagnosis not present

## 2019-03-25 DIAGNOSIS — D62 Acute posthemorrhagic anemia: Secondary | ICD-10-CM | POA: Diagnosis not present

## 2019-03-25 DIAGNOSIS — I63512 Cerebral infarction due to unspecified occlusion or stenosis of left middle cerebral artery: Secondary | ICD-10-CM

## 2019-03-25 DIAGNOSIS — I6389 Other cerebral infarction: Secondary | ICD-10-CM

## 2019-03-25 DIAGNOSIS — I63412 Cerebral infarction due to embolism of left middle cerebral artery: Secondary | ICD-10-CM | POA: Diagnosis not present

## 2019-03-25 DIAGNOSIS — J690 Pneumonitis due to inhalation of food and vomit: Secondary | ICD-10-CM | POA: Diagnosis not present

## 2019-03-25 DIAGNOSIS — I63513 Cerebral infarction due to unspecified occlusion or stenosis of bilateral middle cerebral arteries: Secondary | ICD-10-CM | POA: Diagnosis present

## 2019-03-25 DIAGNOSIS — G936 Cerebral edema: Secondary | ICD-10-CM | POA: Diagnosis present

## 2019-03-25 DIAGNOSIS — C50911 Malignant neoplasm of unspecified site of right female breast: Secondary | ICD-10-CM | POA: Diagnosis not present

## 2019-03-25 DIAGNOSIS — I63429 Cerebral infarction due to embolism of unspecified anterior cerebral artery: Secondary | ICD-10-CM | POA: Diagnosis not present

## 2019-03-25 DIAGNOSIS — J9601 Acute respiratory failure with hypoxia: Secondary | ICD-10-CM | POA: Diagnosis not present

## 2019-03-25 DIAGNOSIS — R402122 Coma scale, eyes open, to pain, at arrival to emergency department: Secondary | ICD-10-CM | POA: Diagnosis present

## 2019-03-25 DIAGNOSIS — R402232 Coma scale, best verbal response, inappropriate words, at arrival to emergency department: Secondary | ICD-10-CM | POA: Diagnosis present

## 2019-03-25 DIAGNOSIS — K117 Disturbances of salivary secretion: Secondary | ICD-10-CM | POA: Diagnosis not present

## 2019-03-25 DIAGNOSIS — J9600 Acute respiratory failure, unspecified whether with hypoxia or hypercapnia: Secondary | ICD-10-CM

## 2019-03-25 DIAGNOSIS — R05 Cough: Secondary | ICD-10-CM

## 2019-03-25 DIAGNOSIS — Z95818 Presence of other cardiac implants and grafts: Secondary | ICD-10-CM | POA: Diagnosis not present

## 2019-03-25 DIAGNOSIS — R402352 Coma scale, best motor response, localizes pain, at arrival to emergency department: Secondary | ICD-10-CM | POA: Diagnosis present

## 2019-03-25 DIAGNOSIS — R4701 Aphasia: Secondary | ICD-10-CM | POA: Diagnosis present

## 2019-03-25 DIAGNOSIS — Z8673 Personal history of transient ischemic attack (TIA), and cerebral infarction without residual deficits: Secondary | ICD-10-CM | POA: Diagnosis not present

## 2019-03-25 DIAGNOSIS — G8191 Hemiplegia, unspecified affecting right dominant side: Secondary | ICD-10-CM | POA: Diagnosis present

## 2019-03-25 DIAGNOSIS — R918 Other nonspecific abnormal finding of lung field: Secondary | ICD-10-CM | POA: Diagnosis not present

## 2019-03-25 DIAGNOSIS — C50919 Malignant neoplasm of unspecified site of unspecified female breast: Secondary | ICD-10-CM | POA: Diagnosis present

## 2019-03-25 DIAGNOSIS — Z66 Do not resuscitate: Secondary | ICD-10-CM | POA: Diagnosis present

## 2019-03-25 DIAGNOSIS — Z853 Personal history of malignant neoplasm of breast: Secondary | ICD-10-CM | POA: Diagnosis not present

## 2019-03-25 DIAGNOSIS — I6300 Cerebral infarction due to thrombosis of unspecified precerebral artery: Secondary | ICD-10-CM

## 2019-03-25 DIAGNOSIS — C801 Malignant (primary) neoplasm, unspecified: Secondary | ICD-10-CM | POA: Diagnosis not present

## 2019-03-25 DIAGNOSIS — Z515 Encounter for palliative care: Secondary | ICD-10-CM | POA: Diagnosis present

## 2019-03-25 DIAGNOSIS — J449 Chronic obstructive pulmonary disease, unspecified: Secondary | ICD-10-CM | POA: Diagnosis present

## 2019-03-25 DIAGNOSIS — R059 Cough, unspecified: Secondary | ICD-10-CM

## 2019-03-25 DIAGNOSIS — C799 Secondary malignant neoplasm of unspecified site: Secondary | ICD-10-CM | POA: Diagnosis present

## 2019-03-25 DIAGNOSIS — R1319 Other dysphagia: Secondary | ICD-10-CM | POA: Diagnosis present

## 2019-03-25 DIAGNOSIS — K589 Irritable bowel syndrome without diarrhea: Secondary | ICD-10-CM | POA: Diagnosis present

## 2019-03-25 DIAGNOSIS — Z7982 Long term (current) use of aspirin: Secondary | ICD-10-CM | POA: Diagnosis not present

## 2019-03-25 DIAGNOSIS — Z1159 Encounter for screening for other viral diseases: Secondary | ICD-10-CM | POA: Diagnosis not present

## 2019-03-25 DIAGNOSIS — R509 Fever, unspecified: Secondary | ICD-10-CM | POA: Diagnosis not present

## 2019-03-25 DIAGNOSIS — R000 Tachycardia, unspecified: Secondary | ICD-10-CM | POA: Diagnosis not present

## 2019-03-25 HISTORY — PX: IR PERCUTANEOUS ART THROMBECTOMY/INFUSION INTRACRANIAL INC DIAG ANGIO: IMG6087

## 2019-03-25 HISTORY — PX: IR CT HEAD LTD: IMG2386

## 2019-03-25 HISTORY — DX: Malignant neoplasm of unspecified site of unspecified female breast: C50.919

## 2019-03-25 HISTORY — PX: RADIOLOGY WITH ANESTHESIA: SHX6223

## 2019-03-25 LAB — CBC
HCT: 35.3 % — ABNORMAL LOW (ref 36.0–46.0)
Hemoglobin: 11.2 g/dL — ABNORMAL LOW (ref 12.0–15.0)
MCH: 27.5 pg (ref 26.0–34.0)
MCHC: 31.7 g/dL (ref 30.0–36.0)
MCV: 86.7 fL (ref 80.0–100.0)
Platelets: 256 10*3/uL (ref 150–400)
RBC: 4.07 MIL/uL (ref 3.87–5.11)
RDW: 15.1 % (ref 11.5–15.5)
WBC: 12.9 10*3/uL — ABNORMAL HIGH (ref 4.0–10.5)
nRBC: 0 % (ref 0.0–0.2)

## 2019-03-25 LAB — DIFFERENTIAL
Abs Immature Granulocytes: 0.06 10*3/uL (ref 0.00–0.07)
Basophils Absolute: 0.1 10*3/uL (ref 0.0–0.1)
Basophils Relative: 1 %
Eosinophils Absolute: 0.5 10*3/uL (ref 0.0–0.5)
Eosinophils Relative: 4 %
Immature Granulocytes: 1 %
Lymphocytes Relative: 14 %
Lymphs Abs: 1.8 10*3/uL (ref 0.7–4.0)
Monocytes Absolute: 1 10*3/uL (ref 0.1–1.0)
Monocytes Relative: 8 %
Neutro Abs: 9.5 10*3/uL — ABNORMAL HIGH (ref 1.7–7.7)
Neutrophils Relative %: 72 %

## 2019-03-25 LAB — GLUCOSE, CAPILLARY: Glucose-Capillary: 106 mg/dL — ABNORMAL HIGH (ref 70–99)

## 2019-03-25 LAB — CBG MONITORING, ED
Glucose-Capillary: 68 mg/dL — ABNORMAL LOW (ref 70–99)
Glucose-Capillary: 95 mg/dL (ref 70–99)

## 2019-03-25 LAB — COMPREHENSIVE METABOLIC PANEL
ALT: 22 U/L (ref 0–44)
AST: 25 U/L (ref 15–41)
Albumin: 3.6 g/dL (ref 3.5–5.0)
Alkaline Phosphatase: 90 U/L (ref 38–126)
Anion gap: 8 (ref 5–15)
BUN: 29 mg/dL — ABNORMAL HIGH (ref 8–23)
CO2: 22 mmol/L (ref 22–32)
Calcium: 9.1 mg/dL (ref 8.9–10.3)
Chloride: 110 mmol/L (ref 98–111)
Creatinine, Ser: 1.09 mg/dL — ABNORMAL HIGH (ref 0.44–1.00)
GFR calc Af Amer: >60 mL/min (ref >60)
GFR calc non Af Amer: 53 mL/min — ABNORMAL LOW (ref >60)
Glucose, Bld: 115 mg/dL — ABNORMAL HIGH (ref 70–99)
Potassium: 4.2 mmol/L (ref 3.5–5.1)
Sodium: 140 mmol/L (ref 135–145)
Total Bilirubin: 0.5 mg/dL (ref 0.3–1.2)
Total Protein: 7.2 g/dL (ref 6.5–8.1)

## 2019-03-25 LAB — ECHOCARDIOGRAM COMPLETE
Height: 66 in
Weight: 2608 oz

## 2019-03-25 LAB — POCT I-STAT 7, (LYTES, BLD GAS, ICA,H+H)
Acid-base deficit: 4 mmol/L — ABNORMAL HIGH (ref 0.0–2.0)
Bicarbonate: 19.9 mmol/L — ABNORMAL LOW (ref 20.0–28.0)
Calcium, Ion: 1.17 mmol/L (ref 1.15–1.40)
HCT: 28 % — ABNORMAL LOW (ref 36.0–46.0)
Hemoglobin: 9.5 g/dL — ABNORMAL LOW (ref 12.0–15.0)
O2 Saturation: 100 %
Patient temperature: 96.7
Potassium: 4.5 mmol/L (ref 3.5–5.1)
Sodium: 137 mmol/L (ref 135–145)
TCO2: 21 mmol/L — ABNORMAL LOW (ref 22–32)
pCO2 arterial: 28.3 mmHg — ABNORMAL LOW (ref 32.0–48.0)
pH, Arterial: 7.45 (ref 7.350–7.450)
pO2, Arterial: 166 mmHg — ABNORMAL HIGH (ref 83.0–108.0)

## 2019-03-25 LAB — APTT: aPTT: 39 seconds — ABNORMAL HIGH (ref 24–36)

## 2019-03-25 LAB — MRSA PCR SCREENING: MRSA by PCR: NEGATIVE

## 2019-03-25 LAB — PROTIME-INR
INR: 1.1 (ref 0.8–1.2)
Prothrombin Time: 13.6 seconds (ref 11.4–15.2)

## 2019-03-25 LAB — SARS CORONAVIRUS 2 BY RT PCR (HOSPITAL ORDER, PERFORMED IN ~~LOC~~ HOSPITAL LAB): SARS Coronavirus 2: NEGATIVE

## 2019-03-25 SURGERY — IR WITH ANESTHESIA
Anesthesia: General

## 2019-03-25 MED ORDER — EPTIFIBATIDE 20 MG/10ML IV SOLN
INTRAVENOUS | Status: AC
  Filled 2019-03-25: qty 10

## 2019-03-25 MED ORDER — TIROFIBAN HCL IN NACL 5-0.9 MG/100ML-% IV SOLN
INTRAVENOUS | Status: AC
  Filled 2019-03-25: qty 100

## 2019-03-25 MED ORDER — ORAL CARE MOUTH RINSE
15.0000 mL | OROMUCOSAL | Status: DC
  Administered 2019-03-25 – 2019-03-26 (×10): 15 mL via OROMUCOSAL

## 2019-03-25 MED ORDER — CEFAZOLIN SODIUM-DEXTROSE 1-4 GM/50ML-% IV SOLN
INTRAVENOUS | Status: DC | PRN
  Administered 2019-03-25: 2 g via INTRAVENOUS

## 2019-03-25 MED ORDER — ACETAMINOPHEN 325 MG PO TABS: 650.0000 mg | ORAL_TABLET | ORAL | Status: DC | PRN

## 2019-03-25 MED ORDER — ACETAMINOPHEN 160 MG/5ML PO SOLN: 650.0000 mg | ORAL | Status: DC | PRN

## 2019-03-25 MED ORDER — LIDOCAINE HCL 1 % IJ SOLN
INTRAMUSCULAR | Status: AC
  Filled 2019-03-25: qty 20

## 2019-03-25 MED ORDER — SENNOSIDES-DOCUSATE SODIUM 8.6-50 MG PO TABS: 1.0000 | ORAL_TABLET | Freq: Every evening | ORAL | Status: DC | PRN

## 2019-03-25 MED ORDER — CHLORHEXIDINE GLUCONATE 0.12% ORAL RINSE (MEDLINE KIT)
15.0000 mL | Freq: Two times a day (BID) | OROMUCOSAL | Status: DC
  Administered 2019-03-25 – 2019-03-26 (×2): 15 mL via OROMUCOSAL

## 2019-03-25 MED ORDER — ASPIRIN 81 MG PO CHEW
CHEWABLE_TABLET | ORAL | Status: AC
  Filled 2019-03-25: qty 1

## 2019-03-25 MED ORDER — NITROGLYCERIN 1 MG/10 ML FOR IR/CATH LAB
INTRA_ARTERIAL | Status: AC
  Filled 2019-03-25: qty 10

## 2019-03-25 MED ORDER — PHENYLEPHRINE 40 MCG/ML (10ML) SYRINGE FOR IV PUSH (FOR BLOOD PRESSURE SUPPORT)
PREFILLED_SYRINGE | INTRAVENOUS | Status: DC | PRN
  Administered 2019-03-25: 120 ug via INTRAVENOUS

## 2019-03-25 MED ORDER — SUCCINYLCHOLINE CHLORIDE 200 MG/10ML IV SOSY
PREFILLED_SYRINGE | INTRAVENOUS | Status: DC | PRN
  Administered 2019-03-25: 160 mg via INTRAVENOUS

## 2019-03-25 MED ORDER — CHLORHEXIDINE GLUCONATE CLOTH 2 % EX PADS
6.0000 | MEDICATED_PAD | Freq: Every day | CUTANEOUS | Status: DC
  Administered 2019-03-25: 6 via TOPICAL

## 2019-03-25 MED ORDER — FENTANYL CITRATE (PF) 100 MCG/2ML IJ SOLN
INTRAMUSCULAR | Status: AC
  Filled 2019-03-25: qty 2

## 2019-03-25 MED ORDER — CLOPIDOGREL BISULFATE 300 MG PO TABS
ORAL_TABLET | ORAL | Status: AC
  Filled 2019-03-25: qty 1

## 2019-03-25 MED ORDER — IOHEXOL 300 MG/ML  SOLN
150.0000 mL | Freq: Once | INTRAMUSCULAR | Status: AC | PRN
  Administered 2019-03-25: 12:00:00 70 mL via INTRA_ARTERIAL

## 2019-03-25 MED ORDER — PANTOPRAZOLE SODIUM 40 MG IV SOLR
40.0000 mg | Freq: Every day | INTRAVENOUS | Status: DC
  Administered 2019-03-25 – 2019-03-29 (×5): 40 mg via INTRAVENOUS
  Filled 2019-03-25 (×6): qty 40

## 2019-03-25 MED ORDER — ACETAMINOPHEN 650 MG RE SUPP
650.0000 mg | RECTAL | Status: DC | PRN
  Administered 2019-03-26 – 2019-03-27 (×3): 650 mg via RECTAL
  Filled 2019-03-25 (×3): qty 1

## 2019-03-25 MED ORDER — LIDOCAINE 2% (20 MG/ML) 5 ML SYRINGE
INTRAMUSCULAR | Status: DC | PRN
  Administered 2019-03-25: 60 mg via INTRAVENOUS

## 2019-03-25 MED ORDER — SODIUM CHLORIDE 0.9 % IV SOLN
INTRAVENOUS | Status: DC
  Administered 2019-03-25 – 2019-03-29 (×3): via INTRAVENOUS

## 2019-03-25 MED ORDER — ACETAMINOPHEN 650 MG RE SUPP: 650.0000 mg | RECTAL | Status: DC | PRN

## 2019-03-25 MED ORDER — CEFAZOLIN SODIUM-DEXTROSE 2-4 GM/100ML-% IV SOLN
INTRAVENOUS | Status: AC
  Filled 2019-03-25: qty 100

## 2019-03-25 MED ORDER — IOPAMIDOL (ISOVUE-370) INJECTION 76%
100.0000 mL | Freq: Once | INTRAVENOUS | Status: AC | PRN
  Administered 2019-03-25: 100 mL via INTRAVENOUS

## 2019-03-25 MED ORDER — TICAGRELOR 90 MG PO TABS
ORAL_TABLET | ORAL | Status: AC
  Filled 2019-03-25: qty 2

## 2019-03-25 MED ORDER — CHLORHEXIDINE GLUCONATE CLOTH 2 % EX PADS
6.0000 | MEDICATED_PAD | Freq: Every day | CUTANEOUS | Status: DC
  Administered 2019-03-25: 20:00:00 6 via TOPICAL

## 2019-03-25 MED ORDER — DEXMEDETOMIDINE HCL IN NACL 200 MCG/50ML IV SOLN: 0.4000 ug/kg/h | INTRAVENOUS | Status: DC

## 2019-03-25 MED ORDER — ASPIRIN 325 MG PO TABS
ORAL_TABLET | ORAL | Status: AC
  Filled 2019-03-25: qty 1

## 2019-03-25 MED ORDER — SODIUM CHLORIDE 0.9% FLUSH: 3.0000 mL | Freq: Once | INTRAVENOUS | Status: DC

## 2019-03-25 MED ORDER — NITROGLYCERIN 1 MG/10 ML FOR IR/CATH LAB
INTRA_ARTERIAL | Status: DC | PRN
  Administered 2019-03-25 (×2): 25 ug via INTRA_ARTERIAL

## 2019-03-25 MED ORDER — ROCURONIUM BROMIDE 10 MG/ML (PF) SYRINGE
PREFILLED_SYRINGE | INTRAVENOUS | Status: DC | PRN
  Administered 2019-03-25: 50 mg via INTRAVENOUS
  Administered 2019-03-25 (×2): 20 mg via INTRAVENOUS

## 2019-03-25 MED ORDER — STROKE: EARLY STAGES OF RECOVERY BOOK
Freq: Once | Status: DC
  Filled 2019-03-25: qty 1

## 2019-03-25 MED ORDER — SODIUM CHLORIDE 0.9 % IV BOLUS (SEPSIS)
1000.0000 mL | Freq: Once | INTRAVENOUS | Status: AC
  Administered 2019-03-25: 11:00:00 1000 mL via INTRAVENOUS

## 2019-03-25 MED ORDER — FENTANYL CITRATE (PF) 100 MCG/2ML IJ SOLN: 25.0000 ug | INTRAMUSCULAR | Status: DC | PRN

## 2019-03-25 MED ORDER — FENTANYL CITRATE (PF) 250 MCG/5ML IJ SOLN
INTRAMUSCULAR | Status: DC | PRN
  Administered 2019-03-25 (×2): 50 ug via INTRAVENOUS

## 2019-03-25 MED ORDER — PROPOFOL 10 MG/ML IV BOLUS
INTRAVENOUS | Status: DC | PRN
  Administered 2019-03-25: 100 mg via INTRAVENOUS

## 2019-03-25 MED ORDER — PROPOFOL 500 MG/50ML IV EMUL
INTRAVENOUS | Status: DC | PRN
  Administered 2019-03-25: 50 ug/kg/min via INTRAVENOUS

## 2019-03-25 MED ORDER — SODIUM CHLORIDE 0.9 % IV SOLN
INTRAVENOUS | Status: DC | PRN
  Administered 2019-03-25: 20 ug/min via INTRAVENOUS

## 2019-03-25 MED ORDER — CLEVIDIPINE BUTYRATE 0.5 MG/ML IV EMUL: 0.0000 mg/h | INTRAVENOUS | Status: AC

## 2019-03-25 MED ORDER — FENTANYL CITRATE (PF) 100 MCG/2ML IJ SOLN
25.0000 ug | INTRAMUSCULAR | Status: DC | PRN
  Administered 2019-03-25 – 2019-03-26 (×6): 100 ug via INTRAVENOUS
  Filled 2019-03-25 (×7): qty 2

## 2019-03-25 MED ORDER — LACTATED RINGERS IV SOLN
INTRAVENOUS | Status: DC | PRN
  Administered 2019-03-25: 11:00:00 via INTRAVENOUS

## 2019-03-25 NOTE — ED Provider Notes (Signed)
Patient presented via [facility] from [facility].  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  Seen and cared for at [facility].  Patient accepted by Dr. Lorraine Lax to go to IR.  She presents to the ED to be intubated prior to interventional radiology Here patient's blood pressure is 110/70. Patient has eyes open but does have aphasia on exam. She is able to open her mouth and dentition is intact Neck is supple with full range of motion  Discussed need for possible blood pressure support post intubation with Dr. Lorraine Lax Pharmacist is consulted regarding blood pressure support Anesthesia called and were awaiting her present [place] and IR.  They have reviewed the chart and patient's COVID test has come back negative from [place]. Intubation in IR discussed with Dr. Dora Sims via nurse.  Given negative COVID test, they are okay with patient being intubated by anesthesia in interventional radiology.  Nurse anesthetist present at bedside and this case has been discussed with her.  They will proceed to interventional radiology for intubation as patient's blood pressures have been on the low side and there is concern that she will need to have pressors available and more intensive blood pressure management CRITICAL CARE Performed by: Pattricia Boss Total critical care time: 30 minutes Critical care time was exclusive of separately billable procedures and treating other patients. Critical care was necessary to treat or prevent imminent or life-threatening deterioration. Critical care was time spent personally by me on the following activities: development of treatment plan with patient and/or surrogate as well as nursing, discussions with consultants, evaluation of patient's response to treatment, examination of patient, obtaining history from patient or surrogate, ordering and performing treatments and interventions, ordering and review of laboratory studies, ordering and review of radiographic  studies, pulse oximetry and re-evaluation of patient's condition.  Vitals:   04/16/2019 1015 04/04/2019 1030  BP: (!) 123/110 (!) 95/57  Pulse: 89   Resp:  (!) 35  Temp:  (!) 96.7 F (35.9 C)  SpO2: 94%       Pattricia Boss, MD 03/31/2019 1114

## 2019-03-25 NOTE — Progress Notes (Signed)
  Echocardiogram  2D Echocardiogram has been performed.  Megan Hancock L Androw 04/18/2019, 3:54 PM

## 2019-03-25 NOTE — ED Notes (Signed)
EMTALA reviewed by charge RN 

## 2019-03-25 NOTE — ED Notes (Signed)
Husband remains at bedside. Aware of transfer.

## 2019-03-25 NOTE — ED Notes (Signed)
Report to [facility] RN

## 2019-03-25 NOTE — Consult Note (Addendum)
[facility] Tele[facility]urology Consult Services   Date of Service:   04/05/2019 07:37:38  Impression:     .  Rule Out Acute Ischemic Stroke     .  Left Hemispheric Infarct     .  MCA Distribution Infarct  Comments/Sign-Out: 68 yo F hx metastatic breast cancer (no hx brain mets) with recent R MCA stroke 2 months ago now with expressive aphasia and right sided deficits since waking up this morning . CT shows early ischemic change. Not tPA candidate outside window.   CTA/CTP ordered to rule out proixmal LVO If LVO found will contact NIR  CTA shows Left M2 cutofff with approx 50% completed infarct on CTP (31cc core, 62 cc in (. Called transfer line at 0843 to discuss case with NIR.  NIR reecommended I speak to Neurohospitalist on call at [facility].  Neurohospitalist paged out at [phone].    Spoke with Dr. Lorraine Lax regarding case, significant deficits likely attributable to penumbra rather than completed infarct so far on imaging. Had discussed the possibility of recanalization in case proximal LVO found earlier with husband. Dr. Lorraine Lax agreed to accept for transfer for thrombectomy  Mechanism of Stroke: Possible Thromboembolic Possible Cardioembolic  Metrics: Last Known Well: 03/24/2019 23:00:00 [facility] Notification Time: 04/13/2019 07:37:38 Arrival Time: 04/04/2019 07:06:00 Stamp Time: 04/21/2019 07:37:38 Time First Login Attempt: 04/09/2019 07:40:52 Video Start Time: 04/14/2019 07:43:37  Symptoms: right sided weakness, aphasia NIHSS Start Assessment Time: 04/21/2019 07:46:45 Patient is not a candidate for tPA. Patient was not deemed candidate for tPA thrombolytics because of Last Well Known Above 4.5 Hours. Video End Time: 04/21/2019 07:58:00  CT head was reviewed and results were: left MCA ischemic changes  Clinical Presentation is Suggestive of Large Vessel Occlusive Disease, Recommendations are as Follows  CTA Head and Neck. CT Perfusion.   ED Physician  notified of diagnostic impression and management plan on 04/04/2019 08:01:15  Our recommendations are outlined below.  Recommendations:     .  Activate Stroke Protocol Admission/Order Set     .  Stroke/Telemetry Floor     .  Neuro Checks     .  Bedside Swallow Eval     .  DVT Prophylaxis     .  IV Fluids, Normal Saline     .  Head of Bed 30 Degrees     .  Euglycemia and Avoid Hyperthermia (PRN Acetaminophen)     .  Antiplatelet Therapy Recommended  Routine Consultation with [facility] for Follow up Care  Sign Out:     .  Discussed with Emergency Department Provider    ------------------------------------------------------------------------------  History of Present Illness: Patient is a 68 year old Female.  Patient was brought by EMS for symptoms of right sided weakness, aphasia  right sided flaccid paralysis and unable to speak upon waking up, last normal at 11p before bed; she also had a stroke 2 months ago involving R MCA but has no residual deficits.  She has metastatic breast cancer but no history of brain involvement in the past.   Last seen normal was beyond 4.5 hours of presentation. There is no history of Recent Anticoagulants. There is no history of recent major surgery. There is history of recent stroke.  Examination: Blood Glucose(106) 1A: Level of Consciousness - Alert; keenly responsive + 0 1B: Ask Month and Age - Aphasic + 2 1C: Blink Eyes & Squeeze Hands - Performs 0 Tasks + 2 2: Test Horizontal Extraocular Movements - Forced Gaze Palsy: Cannot Be Overcome +  2 3: Test Visual Fields - Complete Hemianopia + 2 4: Test Facial Palsy (Use Grimace if Obtunded) - Partial paralysis (lower face) + 2 5A: Test Left Arm Motor Drift - No Drift for 10 Seconds + 0 5B: Test Right Arm Motor Drift - Drift, but doesn't hit bed + 1 6A: Test Left Leg Motor Drift - No Drift for 5 Seconds + 0 6B: Test Right Leg Motor Drift - No Effort Against Gravity + 3 7: Test Limb  Ataxia (FNF/Heel-Shin) - No Ataxia + 0 8: Test Sensation - Mild-Moderate Loss: Less Sharp/More Dull + 1 9: Test Language/Aphasia - Mute/Global Aphasia: No Usable Speech/Auditory Comprehension + 3 10: Test Dysarthria - Severe Dysarthria: Unintelligble Slurring or Out of Proportion to Aphasia + 2 11: Test Extinction/Inattention - No abnormality + 0  NIHSS Score: 20  Patient/Family was informed the Neurology Consult would happen via TeleHealth consult by way of interactive audio and video telecommunications and consented to receiving care in this manner.  Due to the immediate potential for life-threatening deterioration due to underlying acute neurologic illness, I spent 35 minutes providing critical care. This time includes time for face to face visit via telemedicine, review of medical records, imaging studies and discussion of findings with providers, the patient and/or family.   Dr Leda Quail   [facility] [phone]   Case [id number]

## 2019-03-25 NOTE — Progress Notes (Signed)
Patient ID: Megan Hancock, female   DOB: 23-Aug-1951, 68 y.o.   MRN: [id number] INR. Post procedure. CT brain early ischemic changes in the Lt parietal and post front regions. Also hyperattenuation overlying the left parietal cortical region and Lt central sulcus, a  combination of contrast and ? Blood.. No mass effect. 9F exoseal applied for Rt groin hemstasis. Distal pulses dopplerable DPs and PTs bilaterally. S.Aleria Maheu MD

## 2019-03-25 NOTE — Transfer of Care (Signed)
Immediate Anesthesia Transfer of Care Note  Patient: Megan Hancock  Procedure(s) Performed: IR WITH ANESTHESIA/CODE STROKE (N/A )  Patient Location: ICU  Anesthesia Type:General  Level of Consciousness: Patient remains intubated per anesthesia plan  Airway & Oxygen Therapy: Patient remains intubated per anesthesia plan and Patient placed on Ventilator (see vital sign flow sheet for setting)  Post-op Assessment: Report given to RN and Post -op Vital signs reviewed and stable  Post vital signs: Reviewed and stable  Last Vitals:  Vitals Value Taken Time  BP 140/81 04/06/2019 12:36 PM  Temp    Pulse 103 04/11/2019 12:36 PM  Resp 18 03/30/2019 12:45 PM  SpO2 100 % 03/31/2019 12:36 PM  Vitals shown include unvalidated device data.  Last Pain:  Vitals:   04/23/2019 1030  TempSrc: Temporal         Complications: No apparent anesthesia complications

## 2019-03-25 NOTE — Progress Notes (Signed)
Patient ID: Megan Hancock, female   DOB: May 30, 1951, 67 y.o.   MRN: [id number] INR.  [facility] Y R H F LSW 1pm LN. MRSS 0. Found with aphasia and RT sided weakness. CT Brain NIo ICH . ASPECTS 7 to 8. CTA occluded Lt MCA INf division mid M2 seg  CTP core vol 27ml with Tmax> 6s 62 ml and mismatch of 76ml  Endovascular revascularization D/W husband. Reasons alternatives and risks reviewed. Risks of ICH 10 % or more given changes on the CT and CTPmaps , worsening neuro deficit death ,inability to revascularize and vascular injury all reviewed. Husband understands and provided witnessed consent to proceed with the treatment. S.Tyleek Smick MD

## 2019-03-25 NOTE — Anesthesia Procedure Notes (Signed)
Procedure Name: Intubation Date/Time: 04/10/2019 10:54 AM Performed by: Elayne Snare, CRNA Pre-anesthesia Checklist: Patient identified, Emergency Drugs available, Suction available and Patient being monitored Patient Re-evaluated:Patient Re-evaluated prior to induction Oxygen Delivery Method: Circle System Utilized Preoxygenation: Pre-oxygenation with 100% oxygen Induction Type: IV induction, Rapid sequence and Cricoid Pressure applied Laryngoscope Size: Mac and 3 Grade View: Grade I Tube type: Oral Tube size: 7.0 mm Number of attempts: 1 Airway Equipment and Method: Stylet and Oral airway Placement Confirmation: ETT inserted through vocal cords under direct vision,  positive ETCO2 and breath sounds checked- equal and bilateral Secured at: 21 cm Tube secured with: Tape Dental Injury: Teeth and Oropharynx as per pre-operative assessment

## 2019-03-25 NOTE — ED Notes (Signed)
Pt taken to CT with RN 

## 2019-03-25 NOTE — Progress Notes (Signed)
Assisted tele visit to patient with husband.  Kearia Yin Parker, RN  

## 2019-03-25 NOTE — ED Notes (Signed)
NIH difficult to perform. Patient seems to have some degree of both receptive and expressive aphasia.  Will do a task one assessment and then perform it.  Does track with eyes if RN moves in front of patient but will not track finger. Blinks to visual threat but cannot answer to perform visual tests otherwise.  Does not follow commands.

## 2019-03-25 NOTE — ED Notes (Signed)
Waiting on teleneurology MD consult.  RN remains on screen.

## 2019-03-25 NOTE — ED Notes (Signed)
Pt urinated, cleaned and sheets changed.

## 2019-03-25 NOTE — Anesthesia Postprocedure Evaluation (Signed)
Anesthesia Post Note  Patient: Megan Hancock  Procedure(s) Performed: IR WITH ANESTHESIA/CODE STROKE (N/A )     Patient location during evaluation: SICU Anesthesia Type: General Level of consciousness: sedated Pain management: pain level controlled Vital Signs Assessment: post-procedure vital signs reviewed and stable Respiratory status: patient remains intubated per anesthesia plan Cardiovascular status: stable Postop Assessment: no apparent nausea or vomiting Anesthetic complications: no    Last Vitals:  Vitals:   04/16/2019 1235 04/09/2019 1236  BP:  140/81  Pulse: 87 (!) 103  Resp: 16 19  Temp:    SpO2: 100% 100%    Last Pain:  Vitals:   04/16/2019 1030  TempSrc: Temporal                 Megan Hancock DAVID

## 2019-03-25 NOTE — H&P (Signed)
Chief Complaint: unable to speak  History obtained from: Patient's husband and Chart    HPI:                                                                                                                                       Megan Hancock is a 68 y.o. 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 regional ED after her husband noted patient unable to speak.  Last seen normal was 11 PM last night by her husband.  He does she got up around 4 to go to the bathroom.  Teleneurology was consulted at [facility].  Patient underwent a stat CT head which showed early ischemic changes in the left MCA territory.  CT angiogram was performed showed a left M2 occlusion and CT perfusion showed a 31 cc core with approximately equal penumbra.  Patient was transferred to [facility] for IR.  Patient prior to this was independent and had no residual deficits from her right MCA stroke.  Diagnosis of breast cancer was recent and patient has still not started therapy.  Prognosis not clear at this time.  On arrival to [facility], patient's NIH stroke scale was 14.  Blood pressure was 638 systolic.  Patient was intubated in the ED (given protocol change in the setting of COVID).  Blood sugar was 68 per [facility], CBG was 106 at [facility]  Date last known well: 5.31.20 Time last known well: 11pm tPA Given: no, outside window NIHSS: 14 Baseline MRS 0    Past Medical History:  Diagnosis Date  . COPD (chronic obstructive pulmonary disease) ([place])   . Hypercholesteremia   . IBS (irritable bowel syndrome)   . Primary breast cancer with metastasis to other site [facility])     Past Surgical History:  Procedure Laterality Date  . LOOP RECORDER INSERTION N/A 01/01/2019   Procedure: LOOP RECORDER INSERTION;  Surgeon: Isaias Cowman, MD;  Location: [place] [facility];  Service: Cardiovascular;   Laterality: N/A;  . TEE WITHOUT CARDIOVERSION N/A 12/21/2018   Procedure: TRANSESOPHAGEAL ECHOCARDIOGRAM (TEE);  Surgeon: Teodoro Spray, MD;  Location: [facility] ORS;  Service: Cardiovascular;  Laterality: N/A;    Family History  Problem Relation Age of Onset  . Stroke Daughter    Social History:  reports that she has never smoked. She has never used smokeless tobacco. She reports previous alcohol use. No history on file for drug.  Allergies: No Known Allergies  Medications:  I reviewed home medications   ROS:                                                                                                                                     Limited review of systems as patient unable to provide history due to aphasia.  Per husband no recent fevers or chills.   Examination:                                                                                                      General: Appears well-developed  Psych: Affect appropriate to situation Eyes: No scleral injection HENT: No OP obstrucion Head: Normocephalic.  Cardiovascular: Normal rate and regular rhythm.  Respiratory: Effort normal and breath sounds normal to anterior ascultation GI: Soft.  No distension. There is no tenderness.  Skin: WDI    Neurological Examination Mental Status: Alert, but aphasic. Replies questions with "no". Able to close eyes to command, but does not follow other commands.   Cranial Nerves: II: Visual fields : right homonymous hemianopsia III,IV, VI: ptosis not present, patient has left gaze preference but able to cross midline, pupils equal, round, reactive to light and accommodation V,VII: Face appears symmetric, unable to assess facial sensation VIII: Unable to assess IX,X: Unable to assess XII: midline tongue extension Motor: Right : Upper extremity   4/5    Left:     Upper  extremity   5/5  Lower extremity   1/5     Lower extremity   5/5 Tone and bulk:normal tone throughout; no atrophy noted Sensory: Grimaces to noxious stimulus bilaterally Plantars: Right: downgoing   Left: downgoing Cerebellar: No gross ataxia noted on the left side Gait: not able to perform      Lab Results: Basic Metabolic Panel: Recent Labs  Lab 04/23/2019 0726  NA 140  K 4.2  CL 110  CO2 22  GLUCOSE 115*  BUN 29*  CREATININE 1.09*  CALCIUM 9.1    CBC: Recent Labs  Lab 03/31/2019 0726  WBC 12.9*  NEUTROABS 9.5*  HGB 11.2*  HCT 35.3*  MCV 86.7  PLT 256    Coagulation Studies: Recent Labs    04/11/2019 0726  LABPROT 13.6  INR 1.1    Imaging: Ct Angio Head W Or Wo Contrast  Result Date:  CLINICAL DATA:  68 year old female code stroke with evidence of acute left MCA infarct on plain head CT today. Right MCA territory infarct in February. EXAM: CT ANGIOGRAPHY HEAD AND NECK CT PERFUSION  BRAIN TECHNIQUE: Multidetector CT imaging of the head and neck was performed using the standard protocol during bolus administration of intravenous contrast. Multiplanar CT image reconstructions and MIPs were obtained to evaluate the vascular anatomy. Carotid stenosis measurements (when applicable) are obtained utilizing NASCET criteria, using the distal internal carotid diameter as the denominator. Multiphase CT imaging of the brain was performed following IV bolus contrast injection. Subsequent parametric perfusion maps were calculated using RAPID software. CONTRAST:  161mL ISOVUE-370 IOPAMIDOL (ISOVUE-370) INJECTION 76% COMPARISON:  Plain head CT 0734 hours today. Brain MRI and intracranial MRA 12/20/2018. FINDINGS: CT Brain Perfusion Findings: ASPECTS: 8 CBF (<30%) Volume: 39mL Perfusion (Tmax>6.0s) volume: 91mL, hypoperfusion index 0.5 Mismatch Volume: 61mL Infarction Location:Left MCA, primarily posterior division. CTA NECK Skeleton: No acute osseous abnormality identified. Upper  chest: Partially visible left mediastinal mass on series 4, image 1. this measures at least 3-4 centimeters. Centrilobular emphysema. Other neck: Abnormal lymph nodes at the thoracic inlet, level 4 individually up to 14 millimeters short axis. The thyroid is also abnormal and appears inseparable from the lower strap muscles on series 4, image 30. There are additional abnormal cervical lymph nodes including right level 3 nodes up to 13 millimeter short axis and subcutaneous soft tissue nodules up to 17 millimeters (series 4, image 60 and 69. No laryngeal or pharyngeal mass. Aortic arch: Abutted by tumor. 3 vessel arch configuration. Great vessel origins are patent with no stenosis. Right carotid system: Minimal atherosclerosis at the right carotid bifurcation, no stenosis. Left carotid system: Mild soft and calcified plaque at the left ICA origin with no stenosis. Vertebral arteries: Mild-to-moderate right vertebral artery origin stenosis due to partially calcified plaque. The right vertebral artery is tortuous but patent to the skull base without additional stenosis. No proximal left subclavian artery or left vertebral artery origin stenosis. Patent left vertebral artery to the skull base without stenosis. CTA HEAD Posterior circulation: No distal vertebral stenosis. Patent PICA origins, basilar artery, AICA, SCA and PCA origins. Posterior communicating arteries are diminutive or absent. Bilateral PCA branches are within normal limits. Anterior circulation: Both ICA siphons are patent. On the left there is mild calcified plaque without stenosis. On the right there is mild calcified plaque without stenosis. The carotid termini, MCA and ACA origins are patent and normal. Anterior communicating artery and bilateral ACA branches are within normal limits. Left MCA M1 is patent. The left MCA bifurcation is patent. The dominant posterior left M2 branch is occluded just before its bifurcation about 6 or 7 millimeters from  the left MCA bifurcation. See series 10, image 136. The left MCA anterior division appears stable compared to MRA earlier this year. Right MCA M1 and bifurcation are patent without stenosis. Right MCA branches appear patent although there is a moderate to severe stenosis of a distal M2 or M3 branch on series 11, image 13. Venous sinuses: Early contrast timing, not well evaluated. Anatomic variants: Dominant right vertebral artery. Review of the MIP images confirms the above findings IMPRESSION: 1. 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 2. Positive for Left MCA M2 emergent large vessel occlusion with estimated core infarct of 31 mL and penumbra 62 mL with hypoperfusion index of 0.5. 3. The above was discussed by telephone with Dr. Myrene Buddy in the ED on 04/19/2019 at 0833 hours. 4. No carotid artery stenosis. Mild to moderate right vertebral artery origin stenosis, otherwise negative posterior circulation. There is a  right MCA M3 stenosis in the posterior division. Electronically Signed   By: Genevie Ann M.D.   On: 04/05/2019 08:37   Ct Angio Neck W Or Wo Contrast  Result Date: 04/08/2019 CLINICAL DATA:  68 year old female code stroke with evidence of acute left MCA infarct on plain head CT today. Right MCA territory infarct in February. EXAM: CT ANGIOGRAPHY HEAD AND NECK CT PERFUSION BRAIN TECHNIQUE: Multidetector CT imaging of the head and neck was performed using the standard protocol during bolus administration of intravenous contrast. Multiplanar CT image reconstructions and MIPs were obtained to evaluate the vascular anatomy. Carotid stenosis measurements (when applicable) are obtained utilizing NASCET criteria, using the distal internal carotid diameter as the denominator. Multiphase CT imaging of the brain was performed following IV bolus contrast injection. Subsequent parametric perfusion maps were calculated using  RAPID software. CONTRAST:  137mL ISOVUE-370 IOPAMIDOL (ISOVUE-370) INJECTION 76% COMPARISON:  Plain head CT 0734 hours today. Brain MRI and intracranial MRA 12/20/2018. FINDINGS: CT Brain Perfusion Findings: ASPECTS: 8 CBF (<30%) Volume: 51mL Perfusion (Tmax>6.0s) volume: 71mL, hypoperfusion index 0.5 Mismatch Volume: 34mL Infarction Location:Left MCA, primarily posterior division. CTA NECK Skeleton: No acute osseous abnormality identified. Upper chest: Partially visible left mediastinal mass on series 4, image 1. this measures at least 3-4 centimeters. Centrilobular emphysema. Other neck: Abnormal lymph nodes at the thoracic inlet, level 4 individually up to 14 millimeters short axis. The thyroid is also abnormal and appears inseparable from the lower strap muscles on series 4, image 30. There are additional abnormal cervical lymph nodes including right level 3 nodes up to 13 millimeter short axis and subcutaneous soft tissue nodules up to 17 millimeters (series 4, image 60 and 69. No laryngeal or pharyngeal mass. Aortic arch: Abutted by tumor. 3 vessel arch configuration. Great vessel origins are patent with no stenosis. Right carotid system: Minimal atherosclerosis at the right carotid bifurcation, no stenosis. Left carotid system: Mild soft and calcified plaque at the left ICA origin with no stenosis. Vertebral arteries: Mild-to-moderate right vertebral artery origin stenosis due to partially calcified plaque. The right vertebral artery is tortuous but patent to the skull base without additional stenosis. No proximal left subclavian artery or left vertebral artery origin stenosis. Patent left vertebral artery to the skull base without stenosis. CTA HEAD Posterior circulation: No distal vertebral stenosis. Patent PICA origins, basilar artery, AICA, SCA and PCA origins. Posterior communicating arteries are diminutive or absent. Bilateral PCA branches are within normal limits. Anterior circulation: Both ICA siphons  are patent. On the left there is mild calcified plaque without stenosis. On the right there is mild calcified plaque without stenosis. The carotid termini, MCA and ACA origins are patent and normal. Anterior communicating artery and bilateral ACA branches are within normal limits. Left MCA M1 is patent. The left MCA bifurcation is patent. The dominant posterior left M2 branch is occluded just before its bifurcation about 6 or 7 millimeters from the left MCA bifurcation. See series 10, image 136. The left MCA anterior division appears stable compared to MRA earlier this year. Right MCA M1 and bifurcation are patent without stenosis. Right MCA branches appear patent although there is a moderate to severe stenosis of a distal M2 or M3 branch on series 11, image 13. Venous sinuses: Early contrast timing, not well evaluated. Anatomic variants: Dominant right vertebral artery. Review of the MIP images confirms the above findings IMPRESSION: 1. Partially visible malignancy in the upper chest and neck, including lymphadenopathy and a left mediastinal mass which is  at least 3-4 cm. 2. Positive for Left MCA M2 emergent large vessel occlusion with estimated core infarct of 31 mL and penumbra 62 mL with hypoperfusion index of 0.5. 3. The above was discussed by telephone with Dr. Myrene Buddy in the ED on 03/29/2019 at 0833 hours. 4. No carotid artery stenosis. Mild to moderate right vertebral artery origin stenosis, otherwise negative posterior circulation. There is a right MCA M3 stenosis in the posterior division. Electronically Signed   By: Genevie Ann M.D.   On: 04/04/2019 08:37   Ct Cerebral Perfusion W Contrast  Result Date: 04/05/2019 CLINICAL DATA:  68 year old female code stroke with evidence of acute left MCA infarct on plain head CT today. Right MCA territory infarct in February. EXAM: CT ANGIOGRAPHY HEAD AND NECK CT PERFUSION BRAIN TECHNIQUE: Multidetector CT imaging of the head and neck was performed using the standard  protocol during bolus administration of intravenous contrast. Multiplanar CT image reconstructions and MIPs were obtained to evaluate the vascular anatomy. Carotid stenosis measurements (when applicable) are obtained utilizing NASCET criteria, using the distal internal carotid diameter as the denominator. Multiphase CT imaging of the brain was performed following IV bolus contrast injection. Subsequent parametric perfusion maps were calculated using RAPID software. CONTRAST:  148mL ISOVUE-370 IOPAMIDOL (ISOVUE-370) INJECTION 76% COMPARISON:  Plain head CT 0734 hours today. Brain MRI and intracranial MRA 12/20/2018. FINDINGS: CT Brain Perfusion Findings: ASPECTS: 8 CBF (<30%) Volume: 56mL Perfusion (Tmax>6.0s) volume: 76mL, hypoperfusion index 0.5 Mismatch Volume: 54mL Infarction Location:Left MCA, primarily posterior division. CTA NECK Skeleton: No acute osseous abnormality identified. Upper chest: Partially visible left mediastinal mass on series 4, image 1. this measures at least 3-4 centimeters. Centrilobular emphysema. Other neck: Abnormal lymph nodes at the thoracic inlet, level 4 individually up to 14 millimeters short axis. The thyroid is also abnormal and appears inseparable from the lower strap muscles on series 4, image 30. There are additional abnormal cervical lymph nodes including right level 3 nodes up to 13 millimeter short axis and subcutaneous soft tissue nodules up to 17 millimeters (series 4, image 60 and 69. No laryngeal or pharyngeal mass. Aortic arch: Abutted by tumor. 3 vessel arch configuration. Great vessel origins are patent with no stenosis. Right carotid system: Minimal atherosclerosis at the right carotid bifurcation, no stenosis. Left carotid system: Mild soft and calcified plaque at the left ICA origin with no stenosis. Vertebral arteries: Mild-to-moderate right vertebral artery origin stenosis due to partially calcified plaque. The right vertebral artery is tortuous but patent to the  skull base without additional stenosis. No proximal left subclavian artery or left vertebral artery origin stenosis. Patent left vertebral artery to the skull base without stenosis. CTA HEAD Posterior circulation: No distal vertebral stenosis. Patent PICA origins, basilar artery, AICA, SCA and PCA origins. Posterior communicating arteries are diminutive or absent. Bilateral PCA branches are within normal limits. Anterior circulation: Both ICA siphons are patent. On the left there is mild calcified plaque without stenosis. On the right there is mild calcified plaque without stenosis. The carotid termini, MCA and ACA origins are patent and normal. Anterior communicating artery and bilateral ACA branches are within normal limits. Left MCA M1 is patent. The left MCA bifurcation is patent. The dominant posterior left M2 branch is occluded just before its bifurcation about 6 or 7 millimeters from the left MCA bifurcation. See series 10, image 136. The left MCA anterior division appears stable compared to MRA earlier this year. Right MCA M1 and bifurcation are patent without stenosis. Right MCA  branches appear patent although there is a moderate to severe stenosis of a distal M2 or M3 branch on series 11, image 13. Venous sinuses: Early contrast timing, not well evaluated. Anatomic variants: Dominant right vertebral artery. Review of the MIP images confirms the above findings IMPRESSION: 1. 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 2. Positive for Left MCA M2 emergent large vessel occlusion with estimated core infarct of 31 mL and penumbra 62 mL with hypoperfusion index of 0.5. 3. The above was discussed by telephone with Dr. Myrene Buddy in the ED on 04/13/2019 at 0833 hours. 4. No carotid artery stenosis. Mild to moderate right vertebral artery origin stenosis, otherwise negative posterior circulation. There is a right MCA M3 stenosis  in the posterior division. Electronically Signed   By: Genevie Ann M.D.   On: 04/22/2019 08:37   Ct Head Code Stroke Wo Contrast  Addendum Date: 04/08/2019   ADDENDUM REPORT: 04/21/2019 07:51 ADDENDUM: Study discussed by telephone with Dr. Duffy Bruce on 04/09/2019 at 0749 hours. Electronically Signed   By: Genevie Ann M.D.   On: 04/05/2019 07:51   Result Date: 03/26/2019 CLINICAL DATA:  Code stroke. 68 year old female with right side numbness upon waking. EXAM: CT HEAD WITHOUT CONTRAST TECHNIQUE: Contiguous axial images were obtained from the base of the skull through the vertex without intravenous contrast. COMPARISON:  Brain MRI, intracranial MRA and head CT 12/20/2018. FINDINGS: Brain: Encephalomalacia in the right posterior frontal and parietal lobe corresponding to the February infarct. No associated hemorrhage or mass effect. Mild ex vacuo enlargement of the right lateral ventricle. New cytotoxic edema in the left MCA territory most apparent on series 2, image 13 at the posterior left superior temporal gyrus. No associated hemorrhage or mass effect. Involvement of the posterior left insula. Stable gray-white matter differentiation elsewhere. Normal basilar cisterns. Vascular: Mild Calcified atherosclerosis at the skull base. No suspicious left MCA M1 density although there might be an abnormal hyperdense left MCA branch on sagittal image 41. Skull: No acute osseous abnormality identified. Previous right suboccipital craniotomy. Sinuses/Orbits: Visualized paranasal sinuses and mastoids are stable and well pneumatized. Other: New circumscribed low-density scalp soft tissue nodule along the right forehead on series 3, image 12 has a benign appearance, perhaps sebaceous cyst. There is a similar new smaller right scalp lesion on series 3, image 22. negative visible orbits. ASPECTS [facility] Stroke Program Early CT Score) - Ganglionic level infarction (caudate, lentiform nuclei, internal capsule, insula, M1-M3 cortex):  5 - Supraganglionic infarction (M4-M6 cortex): 3 Total score (0-10 with 10 being normal): 8 IMPRESSION: 1. Acute Left MCA infarct most apparent at the left superior temporal gyrus. No associated hemorrhage or mass effect. ASPECTS is 8. 2. No left M1 hyperdensity but possible hyperdense left MCA branch. 3. Expected evolution of the posterior right MCA infarct since February. Electronically Signed: By: Genevie Ann M.D. On: 04/20/2019 07:46     ASSESSMENT AND PLAN   Left MCA stroke secondary to M2 occlusion enroute to the IR for thrombectomy with TICI 2a recanalization  # MRI of the brain without contrast #Transthoracic Echo  # Hold ASA/AC until 24 hr CT, post IR CT concerning for [facility] #Start or continue Atorvastatin 40 mg/other high intensity statin after swallow eval/tube placement  # BP HGDJ:242 -683 systolic BP, cardene gtt ordered # HBAIC and Lipid profile # Telemetry monitoring # Frequent neuro checks # NPO until passes stroke swallow screen   #  Metastatic Breast Cancer - may consider inpatient oncology evaluation if severe stroke disability to establish goals of care   # h/o  R MCA stroke - etiology unclear but possibly due to hypercoagulable state from metastatic cancer     Code status: FULL DVT PPX;SCD     This patient is neurologically critically ill due to LEFT MCA stroke s/p MT.   She is at risk for significant risk of neurological worsening from cerebral edema,  death from brain herniation, heart failure, hemorrhagic conversion, infection, respiratory failure and seizure. This patient's care requires constant monitoring of vital signs, hemodynamics, respiratory and cardiac monitoring, review of multiple databases, neurological assessment, discussion with family, other specialists and medical decision making of high complexity.  I spent 60  minutes of neurocritical time in the care of this patient.     Please page stroke NP  Or  PA  Or MD from 8am -4 pm  as this patient  from this time will be  followed by the stroke.   You can look them up on [email or web address]  Password [facility]      Treazure Nery [facility] Pager Number [phone]

## 2019-03-25 NOTE — ED Notes (Signed)
Dr saha with tele neuro on screen

## 2019-03-25 NOTE — ED Notes (Signed)
To CT with RN.

## 2019-03-25 NOTE — Procedures (Addendum)
S/P Lt common carotid arteriogram RT CFA approach. Findings.. 1SUBJECTIVE:/PLAN: partial revascularization of occluded Lt MCA  Inf division with x 2 passes with 88mm x 36mm embotrap retriever device  And penumbra aspiration achieving a TICI 2b revascularization

## 2019-03-25 NOTE — ED Notes (Addendum)
RN on tele screen

## 2019-03-25 NOTE — Sedation Documentation (Signed)
Patient under the care of anesthesia 

## 2019-03-25 NOTE — Progress Notes (Signed)
Transported patient with CRNA and IR Tech to [phone] without incident. Gave report to RN at bedside, checked groin and pulses. RN agrees with assessment

## 2019-03-25 NOTE — Code Documentation (Signed)
68yo female arriving to [facility] via [place] at 28. Patient from [facility] where she presented via private vehicle with difficulty speaking. Patient noted to have expressive aphasia, dense right hemiparesis and right hemineglect. LKW reportedly 03/24/2019 at 2300. CT at [facility] showing acute Left MCA infarct most apparent at the left superior temporal gyrus. ASPECTS is 8. CTA and CTP at [facility] showing Left MCA M2 emergent large vessel occlusion with estimated core infarct of 31 mL and penumbra 62 mL with hypoperfusion index of 0.5. Patient was outside the window for treatment with tPA. Of note, patient with h/o recent breast cancer diagnosis. Patient transferred to [facility] for intubation prior to endovascular intervention. NIHSS 16, see documentation for details and code stroke times. CBG 68, MD aware. Decision made to intubate in IR as COVID test negative at [facility] and patient with borderline hypotension. Patient transferred to IR by team. Bedside handoff with IR team.

## 2019-03-25 NOTE — Telephone Encounter (Signed)
Dr. Ree Kida called to talk with husband to get consent for pt to have procedure

## 2019-03-25 NOTE — ED Triage Notes (Signed)
Patient arrives to room. Per report patient had recent stroke. Awoke this am unable to speak clear. R sided weakness noted. Unknown if was from previous stroke. Awaiting husband for better history. Patient able to say yes. Does not follow direction well.

## 2019-03-25 NOTE — ED Notes (Signed)
EDP at bedside [facility]

## 2019-03-25 NOTE — ED Triage Notes (Signed)
Pt transferred her from [facility] via [facility]. Pt here for Code stroke to go to IR.

## 2019-03-25 NOTE — Anesthesia Preprocedure Evaluation (Addendum)
Anesthesia Evaluation  Patient identified by MRN, date of birth, ID band Patient awake    Reviewed: Allergy & Precautions, NPO status , Patient's Chart, lab work & pertinent test results  Airway Mallampati: II  TM Distance: >3 FB Neck ROM: Full    Dental no notable dental hx. (+) Teeth Intact, Dental Advisory Given   Pulmonary COPD,    Pulmonary exam normal breath sounds clear to auscultation       Cardiovascular negative cardio ROS Normal cardiovascular exam Rhythm:Regular Rate:Normal  TEE 11/2018  1. The left ventricle has normal systolic function, with an ejection fraction of 60-65%. The cavity size was normal. Left ventricular diastolic parameters were normal No evidence of left ventricular regional wall motion abnormalities.  2. The right ventricle has normal systolc function. The cavity was normal.  3. The mitral valve is normal in structure.  4. The tricuspid valve was normal in structure.  5. The aortic valve is tricuspid.  6. The descending aorta and aortic root are normal in size and structure.  7. There is evidence of plaque in the descending aorta.  8. No evidence of left ventricular regional wall motion abnormalities   Neuro/Psych CVA negative psych ROS   GI/Hepatic negative GI ROS, Neg liver ROS,   Endo/Other  negative endocrine ROS  Renal/GU negative Renal ROS  negative genitourinary   Musculoskeletal negative musculoskeletal ROS (+)   Abdominal   Peds negative pediatric ROS (+)  Hematology negative hematology ROS (+)   Anesthesia Other Findings Acute L MCA stroke  Reproductive/Obstetrics negative OB ROS H/o breast CA                             Anesthesia Physical Anesthesia Plan  ASA: III  Anesthesia Plan: General   Post-op Pain Management:    Induction: Intravenous  PONV Risk Score and Plan: 3 and Ondansetron, Dexamethasone and Treatment may vary due to age or  medical condition  Airway Management Planned: Oral ETT  Additional Equipment:   Intra-op Plan:   Post-operative Plan: Post-operative intubation/ventilation  Informed Consent: I have reviewed the patients History and Physical, chart, labs and discussed the procedure including the risks, benefits and alternatives for the proposed anesthesia with the patient or authorized representative who has indicated his/her understanding and acceptance.     Dental advisory given  Plan Discussed with: CRNA  Anesthesia Plan Comments:        Anesthesia Quick Evaluation

## 2019-03-25 NOTE — ED Provider Notes (Signed)
[place] EMERGENCY DEPARTMENT Provider Note   CSN: [id number] Arrival date & time: 03/31/2019  6387  An emergency department physician performed an initial assessment on this suspected stroke patient at 0722.  History   Chief Complaint Chief Complaint  Patient presents with   Cerebrovascular Accident    HPI Megan Hancock is a 68 y.o. female.      HPI   82 old female with past medical history of stroke, recently diagnosed breast cancer, here with right side deficits.  Patient was last normal yesterday.  She awoke and reportedly had difficulty speaking.  She had expressive aphasia.  She has not wanted to move her right side as well.  Her husband noticed this and subsequently called 911.  She arrives with her husband.  She is unable to find much history due to expressive aphasia.  She denies any pain or complaints at this time.  She does not recall if she has known brain metastases.  Level 5 caveat invoked as remainder of history, ROS, and physical exam limited due to patient's aphasia.   Past Medical History:  Diagnosis Date   COPD (chronic obstructive pulmonary disease) (HCC)    Hypercholesteremia    IBS (irritable bowel syndrome)    Primary breast cancer with metastasis to other site [facility])     Patient Active Problem List   Diagnosis Date Noted   Acute ischemic stroke ([place]) 12/19/2018    Past Surgical History:  Procedure Laterality Date   LOOP RECORDER INSERTION N/A 01/01/2019   Procedure: LOOP RECORDER INSERTION;  Surgeon: Isaias Cowman, MD;  Location: [place] [facility];  Service: Cardiovascular;  Laterality: N/A;   TEE WITHOUT CARDIOVERSION N/A 12/21/2018   Procedure: TRANSESOPHAGEAL ECHOCARDIOGRAM (TEE);  Surgeon: Teodoro Spray, MD;  Location: [facility] ORS;  Service: Cardiovascular;  Laterality: N/A;     OB History   No obstetric history on file.      Home Medications    Prior to Admission medications   Medication Sig  Start Date End Date Taking? Authorizing Provider  aspirin 81 MG chewable tablet Chew 1 tablet (81 mg total) by mouth daily. 12/21/18   Bettey Costa, MD  calcium carbonate (OSCAL) 1500 (600 Ca) MG TABS tablet Take 600 mg of elemental calcium by mouth daily.    [provider]  Cholecalciferol (VITAMIN D) 50 MCG (2000 UT) tablet Take 2,000 Units by mouth daily.    [provider]  Fiber POWD Take 5 g by mouth daily.    [provider]  fluticasone (FLONASE) 50 MCG/ACT nasal spray Place 1-2 sprays into both nostrils daily.    [provider]  mometasone (NASONEX) 50 MCG/ACT nasal spray Place 2 sprays into the nose daily as needed (nasal congestion).    [provider]  mometasone-formoterol (DULERA) 100-5 MCG/ACT AERO Inhale 2 puffs into the lungs 2 (two) times daily.    [provider]  montelukast (SINGULAIR) 10 MG tablet Take 10 mg by mouth every evening.    [provider]  omeprazole (PRILOSEC) 40 MG capsule Take 40 mg by mouth daily.    [provider]  simvastatin (ZOCOR) 40 MG tablet Take 1 tablet (40 mg total) by mouth every evening. 12/21/18   Bettey Costa, MD  tiotropium (SPIRIVA) 18 MCG inhalation capsule Place 18 mcg into inhaler and inhale daily.    [provider]    Family History Family History  Problem Relation Age of Onset   Stroke Daughter  Social History Social History   Tobacco Use   Smoking status: Never Smoker   Smokeless tobacco: Never Used  Substance Use Topics   Alcohol use: Not Currently   Drug use: Not on file     Allergies   Patient has no known allergies.   Review of Systems Review of Systems  Unable to perform ROS: Mental status change     Physical Exam Updated Vital Signs BP 119/77    Pulse 94    Temp 97.6 F (36.4 C) (Oral)    Resp 20    Ht 5\' 6"  (1.676 m)    Wt 73.9 kg    SpO2 98%    BMI 26.31 kg/m   Physical Exam Vitals signs and nursing note reviewed.   Constitutional:      General: She is not in acute distress.    Appearance: She is well-developed.  HENT:     Head: Normocephalic and atraumatic.  Eyes:     Conjunctiva/sclera: Conjunctivae normal.  Neck:     Musculoskeletal: Neck supple.  Cardiovascular:     Rate and Rhythm: Normal rate and regular rhythm.     Heart sounds: Normal heart sounds. No murmur. No friction rub.  Pulmonary:     Effort: Pulmonary effort is normal. No respiratory distress.     Breath sounds: Normal breath sounds. No wheezing or rales.  Abdominal:     General: There is no distension.     Palpations: Abdomen is soft.     Tenderness: There is no abdominal tenderness.  Skin:    General: Skin is warm.     Capillary Refill: Capillary refill takes less than 2 seconds.  Neurological:     Mental Status: She is alert.     Motor: No abnormal muscle tone.     Comments: Oriented to person but not place or time.  Right facial droop noted.  Expressive aphasia noted.  Speech slightly slurred.  Dense right hemiparesis with right-sided neglect noted.  Diminished sensation light touch in her right upper and lower extremity.  Normal strength in left upper extremity.  Has difficulty following commands for left lower extremity.      ED Treatments / Results  Labs (all labs ordered are listed, but only abnormal results are displayed) Labs Reviewed  APTT - Abnormal; Notable for the following components:      Result Value   aPTT 39 (*)    All other components within normal limits  CBC - Abnormal; Notable for the following components:   WBC 12.9 (*)    Hemoglobin 11.2 (*)    HCT 35.3 (*)    All other components within normal limits  DIFFERENTIAL - Abnormal; Notable for the following components:   Neutro Abs 9.5 (*)    All other components within normal limits  COMPREHENSIVE METABOLIC PANEL - Abnormal; Notable for the following components:   Glucose, Bld 115 (*)    BUN 29 (*)    Creatinine, Ser 1.09 (*)    GFR calc  non Af Amer 53 (*)    All other components within normal limits  GLUCOSE, CAPILLARY - Abnormal; Notable for the following components:   Glucose-Capillary 106 (*)    All other components within normal limits  SARS CORONAVIRUS 2 (HOSPITAL ORDER, [place] [facility])  PROTIME-INR  CBG MONITORING, ED    EKG Normal sinus rhythm, ventricular rate 85.  PR 133.  QRS 80.  No acute ischemic changes.  No significant change from prior.  No evidence of arrhythmia.  Radiology Ct Angio Head W Or Wo Contrast  Result Date: 04/08/2019 CLINICAL DATA:  68 year old female code stroke with evidence of acute left MCA infarct on plain head CT today. Right MCA territory infarct in February. EXAM: CT ANGIOGRAPHY HEAD AND NECK CT PERFUSION BRAIN TECHNIQUE: Multidetector CT imaging of the head and neck was performed using the standard protocol during bolus administration of intravenous contrast. Multiplanar CT image reconstructions and MIPs were obtained to evaluate the vascular anatomy. Carotid stenosis measurements (when applicable) are obtained utilizing NASCET criteria, using the distal internal carotid diameter as the denominator. Multiphase CT imaging of the brain was performed following IV bolus contrast injection. Subsequent parametric perfusion maps were calculated using RAPID software. CONTRAST:  122mL ISOVUE-370 IOPAMIDOL (ISOVUE-370) INJECTION 76% COMPARISON:  Plain head CT 0734 hours today. Brain MRI and intracranial MRA 12/20/2018. FINDINGS: CT Brain Perfusion Findings: ASPECTS: 8 CBF (<30%) Volume: 24mL Perfusion (Tmax>6.0s) volume: 66mL, hypoperfusion index 0.5 Mismatch Volume: 97mL Infarction Location:Left MCA, primarily posterior division. CTA NECK Skeleton: No acute osseous abnormality identified. Upper chest: Partially visible left mediastinal mass on series 4, image 1. this measures at least 3-4 centimeters. Centrilobular emphysema. Other neck: Abnormal lymph nodes at the thoracic inlet,  level 4 individually up to 14 millimeters short axis. The thyroid is also abnormal and appears inseparable from the lower strap muscles on series 4, image 30. There are additional abnormal cervical lymph nodes including right level 3 nodes up to 13 millimeter short axis and subcutaneous soft tissue nodules up to 17 millimeters (series 4, image 60 and 69. No laryngeal or pharyngeal mass. Aortic arch: Abutted by tumor. 3 vessel arch configuration. Great vessel origins are patent with no stenosis. Right carotid system: Minimal atherosclerosis at the right carotid bifurcation, no stenosis. Left carotid system: Mild soft and calcified plaque at the left ICA origin with no stenosis. Vertebral arteries: Mild-to-moderate right vertebral artery origin stenosis due to partially calcified plaque. The right vertebral artery is tortuous but patent to the skull base without additional stenosis. No proximal left subclavian artery or left vertebral artery origin stenosis. Patent left vertebral artery to the skull base without stenosis. CTA HEAD Posterior circulation: No distal vertebral stenosis. Patent PICA origins, basilar artery, AICA, SCA and PCA origins. Posterior communicating arteries are diminutive or absent. Bilateral PCA branches are within normal limits. Anterior circulation: Both ICA siphons are patent. On the left there is mild calcified plaque without stenosis. On the right there is mild calcified plaque without stenosis. The carotid termini, MCA and ACA origins are patent and normal. Anterior communicating artery and bilateral ACA branches are within normal limits. Left MCA M1 is patent. The left MCA bifurcation is patent. The dominant posterior left M2 branch is occluded just before its bifurcation about 6 or 7 millimeters from the left MCA bifurcation. See series 10, image 136. The left MCA anterior division appears stable compared to MRA earlier this year. Right MCA M1 and bifurcation are patent without stenosis.  Right MCA branches appear patent although there is a moderate to severe stenosis of a distal M2 or M3 branch on series 11, image 13. Venous sinuses: Early contrast timing, not well evaluated. Anatomic variants: Dominant right vertebral artery. Review of the MIP images confirms the above findings IMPRESSION: 1. 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 2. Positive for Left MCA M2 emergent large vessel occlusion with estimated core infarct  of 31 mL and penumbra 62 mL with hypoperfusion index of 0.5. 3. The above was discussed by telephone with Dr. Myrene Buddy in the ED on 04/11/2019 at 0833 hours. 4. No carotid artery stenosis. Mild to moderate right vertebral artery origin stenosis, otherwise negative posterior circulation. There is a right MCA M3 stenosis in the posterior division. Electronically Signed   By: Genevie Ann M.D.   On: 04/22/2019 08:37   Ct Angio Neck W Or Wo Contrast  Result Date: 04/02/2019 CLINICAL DATA:  68 year old female code stroke with evidence of acute left MCA infarct on plain head CT today. Right MCA territory infarct in February. EXAM: CT ANGIOGRAPHY HEAD AND NECK CT PERFUSION BRAIN TECHNIQUE: Multidetector CT imaging of the head and neck was performed using the standard protocol during bolus administration of intravenous contrast. Multiplanar CT image reconstructions and MIPs were obtained to evaluate the vascular anatomy. Carotid stenosis measurements (when applicable) are obtained utilizing NASCET criteria, using the distal internal carotid diameter as the denominator. Multiphase CT imaging of the brain was performed following IV bolus contrast injection. Subsequent parametric perfusion maps were calculated using RAPID software. CONTRAST:  159mL ISOVUE-370 IOPAMIDOL (ISOVUE-370) INJECTION 76% COMPARISON:  Plain head CT 0734 hours today. Brain MRI and intracranial MRA 12/20/2018. FINDINGS: CT Brain  Perfusion Findings: ASPECTS: 8 CBF (<30%) Volume: 83mL Perfusion (Tmax>6.0s) volume: 45mL, hypoperfusion index 0.5 Mismatch Volume: 60mL Infarction Location:Left MCA, primarily posterior division. CTA NECK Skeleton: No acute osseous abnormality identified. Upper chest: Partially visible left mediastinal mass on series 4, image 1. this measures at least 3-4 centimeters. Centrilobular emphysema. Other neck: Abnormal lymph nodes at the thoracic inlet, level 4 individually up to 14 millimeters short axis. The thyroid is also abnormal and appears inseparable from the lower strap muscles on series 4, image 30. There are additional abnormal cervical lymph nodes including right level 3 nodes up to 13 millimeter short axis and subcutaneous soft tissue nodules up to 17 millimeters (series 4, image 60 and 69. No laryngeal or pharyngeal mass. Aortic arch: Abutted by tumor. 3 vessel arch configuration. Great vessel origins are patent with no stenosis. Right carotid system: Minimal atherosclerosis at the right carotid bifurcation, no stenosis. Left carotid system: Mild soft and calcified plaque at the left ICA origin with no stenosis. Vertebral arteries: Mild-to-moderate right vertebral artery origin stenosis due to partially calcified plaque. The right vertebral artery is tortuous but patent to the skull base without additional stenosis. No proximal left subclavian artery or left vertebral artery origin stenosis. Patent left vertebral artery to the skull base without stenosis. CTA HEAD Posterior circulation: No distal vertebral stenosis. Patent PICA origins, basilar artery, AICA, SCA and PCA origins. Posterior communicating arteries are diminutive or absent. Bilateral PCA branches are within normal limits. Anterior circulation: Both ICA siphons are patent. On the left there is mild calcified plaque without stenosis. On the right there is mild calcified plaque without stenosis. The carotid termini, MCA and ACA origins are patent  and normal. Anterior communicating artery and bilateral ACA branches are within normal limits. Left MCA M1 is patent. The left MCA bifurcation is patent. The dominant posterior left M2 branch is occluded just before its bifurcation about 6 or 7 millimeters from the left MCA bifurcation. See series 10, image 136. The left MCA anterior division appears stable compared to MRA earlier this year. Right MCA M1 and bifurcation are patent without stenosis. Right MCA branches appear patent although there is a moderate to severe stenosis of a distal M2 or  M3 branch on series 11, image 13. Venous sinuses: Early contrast timing, not well evaluated. Anatomic variants: Dominant right vertebral artery. Review of the MIP images confirms the above findings IMPRESSION: 1. 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 2. Positive for Left MCA M2 emergent large vessel occlusion with estimated core infarct of 31 mL and penumbra 62 mL with hypoperfusion index of 0.5. 3. The above was discussed by telephone with Dr. Myrene Buddy in the ED on 04/08/2019 at 0833 hours. 4. No carotid artery stenosis. Mild to moderate right vertebral artery origin stenosis, otherwise negative posterior circulation. There is a right MCA M3 stenosis in the posterior division. Electronically Signed   By: Genevie Ann M.D.   On: 04/10/2019 08:37   Ct Cerebral Perfusion W Contrast  Result Date: 04/15/2019 CLINICAL DATA:  69 year old female code stroke with evidence of acute left MCA infarct on plain head CT today. Right MCA territory infarct in February. EXAM: CT ANGIOGRAPHY HEAD AND NECK CT PERFUSION BRAIN TECHNIQUE: Multidetector CT imaging of the head and neck was performed using the standard protocol during bolus administration of intravenous contrast. Multiplanar CT image reconstructions and MIPs were obtained to evaluate the vascular anatomy. Carotid stenosis measurements (when  applicable) are obtained utilizing NASCET criteria, using the distal internal carotid diameter as the denominator. Multiphase CT imaging of the brain was performed following IV bolus contrast injection. Subsequent parametric perfusion maps were calculated using RAPID software. CONTRAST:  119mL ISOVUE-370 IOPAMIDOL (ISOVUE-370) INJECTION 76% COMPARISON:  Plain head CT 0734 hours today. Brain MRI and intracranial MRA 12/20/2018. FINDINGS: CT Brain Perfusion Findings: ASPECTS: 8 CBF (<30%) Volume: 72mL Perfusion (Tmax>6.0s) volume: 52mL, hypoperfusion index 0.5 Mismatch Volume: 21mL Infarction Location:Left MCA, primarily posterior division. CTA NECK Skeleton: No acute osseous abnormality identified. Upper chest: Partially visible left mediastinal mass on series 4, image 1. this measures at least 3-4 centimeters. Centrilobular emphysema. Other neck: Abnormal lymph nodes at the thoracic inlet, level 4 individually up to 14 millimeters short axis. The thyroid is also abnormal and appears inseparable from the lower strap muscles on series 4, image 30. There are additional abnormal cervical lymph nodes including right level 3 nodes up to 13 millimeter short axis and subcutaneous soft tissue nodules up to 17 millimeters (series 4, image 60 and 69. No laryngeal or pharyngeal mass. Aortic arch: Abutted by tumor. 3 vessel arch configuration. Great vessel origins are patent with no stenosis. Right carotid system: Minimal atherosclerosis at the right carotid bifurcation, no stenosis. Left carotid system: Mild soft and calcified plaque at the left ICA origin with no stenosis. Vertebral arteries: Mild-to-moderate right vertebral artery origin stenosis due to partially calcified plaque. The right vertebral artery is tortuous but patent to the skull base without additional stenosis. No proximal left subclavian artery or left vertebral artery origin stenosis. Patent left vertebral artery to the skull base without stenosis. CTA HEAD  Posterior circulation: No distal vertebral stenosis. Patent PICA origins, basilar artery, AICA, SCA and PCA origins. Posterior communicating arteries are diminutive or absent. Bilateral PCA branches are within normal limits. Anterior circulation: Both ICA siphons are patent. On the left there is mild calcified plaque without stenosis. On the right there is mild calcified plaque without stenosis. The carotid termini, MCA and ACA origins are patent and normal. Anterior communicating artery and bilateral ACA branches are within normal limits. Left MCA M1 is patent. The left MCA bifurcation is patent. The  dominant posterior left M2 branch is occluded just before its bifurcation about 6 or 7 millimeters from the left MCA bifurcation. See series 10, image 136. The left MCA anterior division appears stable compared to MRA earlier this year. Right MCA M1 and bifurcation are patent without stenosis. Right MCA branches appear patent although there is a moderate to severe stenosis of a distal M2 or M3 branch on series 11, image 13. Venous sinuses: Early contrast timing, not well evaluated. Anatomic variants: Dominant right vertebral artery. Review of the MIP images confirms the above findings IMPRESSION: 1. 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 2. Positive for Left MCA M2 emergent large vessel occlusion with estimated core infarct of 31 mL and penumbra 62 mL with hypoperfusion index of 0.5. 3. The above was discussed by telephone with Dr. Myrene Buddy in the ED on 03/28/2019 at 0833 hours. 4. No carotid artery stenosis. Mild to moderate right vertebral artery origin stenosis, otherwise negative posterior circulation. There is a right MCA M3 stenosis in the posterior division. Electronically Signed   By: Genevie Ann M.D.   On: 03/31/2019 08:37   Ct Head Code Stroke Wo Contrast  Addendum Date: 04/16/2019   ADDENDUM REPORT: 04/14/2019 07:51  ADDENDUM: Study discussed by telephone with Dr. Duffy Bruce on 04/12/2019 at 0749 hours. Electronically Signed   By: Genevie Ann M.D.   On: 03/31/2019 07:51   Result Date: 04/04/2019 CLINICAL DATA:  Code stroke. 68 year old female with right side numbness upon waking. EXAM: CT HEAD WITHOUT CONTRAST TECHNIQUE: Contiguous axial images were obtained from the base of the skull through the vertex without intravenous contrast. COMPARISON:  Brain MRI, intracranial MRA and head CT 12/20/2018. FINDINGS: Brain: Encephalomalacia in the right posterior frontal and parietal lobe corresponding to the February infarct. No associated hemorrhage or mass effect. Mild ex vacuo enlargement of the right lateral ventricle. New cytotoxic edema in the left MCA territory most apparent on series 2, image 13 at the posterior left superior temporal gyrus. No associated hemorrhage or mass effect. Involvement of the posterior left insula. Stable gray-white matter differentiation elsewhere. Normal basilar cisterns. Vascular: Mild Calcified atherosclerosis at the skull base. No suspicious left MCA M1 density although there might be an abnormal hyperdense left MCA branch on sagittal image 41. Skull: No acute osseous abnormality identified. Previous right suboccipital craniotomy. Sinuses/Orbits: Visualized paranasal sinuses and mastoids are stable and well pneumatized. Other: New circumscribed low-density scalp soft tissue nodule along the right forehead on series 3, image 12 has a benign appearance, perhaps sebaceous cyst. There is a similar new smaller right scalp lesion on series 3, image 22. negative visible orbits. ASPECTS [facility] Stroke Program Early CT Score) - Ganglionic level infarction (caudate, lentiform nuclei, internal capsule, insula, M1-M3 cortex): 5 - Supraganglionic infarction (M4-M6 cortex): 3 Total score (0-10 with 10 being normal): 8 IMPRESSION: 1. Acute Left MCA infarct most apparent at the left superior temporal gyrus. No  associated hemorrhage or mass effect. ASPECTS is 8. 2. No left M1 hyperdensity but possible hyperdense left MCA branch. 3. Expected evolution of the posterior right MCA infarct since February. Electronically Signed: By: Genevie Ann M.D. On: 04/18/2019 07:46    Procedures .Critical Care Performed by: Duffy Bruce, MD Authorized by: Duffy Bruce, MD   Critical care provider statement:    Critical care time (minutes):  45   Critical care time was exclusive of:  Separately billable procedures  and treating other patients and teaching time   Critical care was necessary to treat or prevent imminent or life-threatening deterioration of the following conditions:  Cardiac failure, circulatory failure and CNS failure or compromise   Critical care was time spent personally by me on the following activities:  Development of treatment plan with patient or surrogate, discussions with consultants, evaluation of patient's response to treatment, examination of patient, obtaining history from patient or surrogate, ordering and performing treatments and interventions, ordering and review of laboratory studies, ordering and review of radiographic studies, pulse oximetry, re-evaluation of patient's condition and review of old charts   I assumed direction of critical care for this patient from another provider in my specialty: no     (including critical care time)  Medications Ordered in ED Medications  sodium chloride flush (NS) 0.9 % injection 3 mL (3 mLs Intravenous Not Given 04/22/2019 0735)  iopamidol (ISOVUE-370) 76 % injection 100 mL (100 mLs Intravenous Contrast Given 04/11/2019 0802)     Initial Impression / Assessment and Plan / ED Course  I have reviewed the triage vital signs and the nursing notes.  Pertinent labs & imaging results that were available during my care of the patient were reviewed by me and considered in my medical decision making (see chart for details).  Clinical Course as of Mar 24 902    Mon Mar 25, 7351  [id number] 68 year old female here with right hemiparesis and expressive aphasia.  Pain scale positive with exam concerning for large vessel occlusion.  Unknown if she has brain metastases.  She is outside the window of TPA but will activate as a code stroke for evaluation of possible IR intervention.  Tele-neuro at bedside and will decide further imaging.   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 Would not be candidate for aggressive treatment most likely per Radiology - Neuro to f/u re: whether it's contraindication or not.   [CI]    Clinical Course User Index [CI] Duffy Bruce, MD       Accepted to [facility] for IR eval. Dr. Rory Percy and Dr. Johnney Killian (EDP) accepting and aware. Airway remains intact. COVID sent prior to transfer. HDS and protecting airway at this time. BP at goal.  Final Clinical Impressions(s) / ED Diagnoses   Final diagnoses:  Ischemic stroke [facility])    ED Discharge Orders    None       Duffy Bruce, MD 04/09/2019 [phone]

## 2019-03-25 NOTE — ED Notes (Signed)
carelink at bedside leaving with pt.  Husband aware of where to go. VSS.

## 2019-03-25 NOTE — Consult Note (Signed)
NAME:  Megan Hancock, MRN:  [id number], DOB:  11/19/1950, LOS: 0 ADMISSION DATE:  04/08/2019, CONSULTATION DATE:  6/1 REFERRING MD: Estanislado Pandy, CHIEF COMPLAINT:  Ventilator management s/p CVA   Brief History   68 year old female admitted on 6/1 after presenting acutely with right-sided weakness and expressive aphasia.  Diagnostic evaluation consistent with left MCA stroke.  She was outside TPA window & also has active malignancy, went to neuroradiology where she underwent partial revascularization of occluded left MCA.  Return to intensive care on ventilator  History of present illness   68 year old female with history as mentioned below.  Presented to the emergency room acutely via code stroke with acute right-sided weakness/hemiparesis and difficulty speaking.  Particularly exhibiting expressive aphasia.  Code stroke was called,CT brain showed early ischemic changes deemed outside TPA window due to timing.  She was intubated for airway protection, and transferred to the neuro interventional radiology suite where she underwent arteriogram and partial endovascular revascularization of the left MCA she will return to the intensive care on the ventilator pulmonary asked to assist with management  Past Medical History  Prior  MCA stroke (only deficits were short term memory and balance), metastatic breast cancer (recently dx'd but apparently widely metastatic-->was to be evaluated by onc on 6/3 in [place])  [place] [facility] Events   6/1 admitted.   Consults:  Critical care 6/1 Procedures:  Intubation 6/1 Cerebral angiogram with partial revascularization of occluded left MCA  Significant Diagnostic Tests:  CT brain 6/1: Partial visible malignancy in the upper chest and neck including lymphadenopathy and left mediastinal mass, positive for left MCA M2 emergent large vessel occlusion  Micro Data:    Antimicrobials:    Interim history/subjective:  Sedated on vent   Objective   Blood  pressure 140/81, pulse (Abnormal) 103, temperature (Abnormal) 96.7 F (35.9 C), temperature source Temporal, resp. rate 19, height 5\' 6"  (1.676 m), SpO2 100 %.    Vent Mode: PRVC FiO2 (%):  [80 %] 80 % Set Rate:  [16 bmp] 16 bmp Vt Set:  [470 mL] 470 mL PEEP:  [5 cmH20] 5 cmH20 Plateau Pressure:  [16 cmH20] 16 cmH20   Intake/Output Summary (Last 24 hours) at 04/16/2019 1300 Last data filed at 04/21/2019 1246 Gross per 24 hour  Intake 600 ml  Output 610 ml  Net -10 ml   There were no vitals filed for this visit.  Examination: General: this is a 68 year old female currently sedated on vent  HENT: NCAT orally intubated. No JVD Lungs: clear decreased bases  Cardiovascular: RRR no MRG Abdomen: soft not tender  Extremities: warm. Toes slightly mottled bilaterally. Pulses are palp Neuro: sedated  GU: cl yellow   Resolved Hospital Problem list     Assessment & Plan:  Acute left MCA CVA s/p partial revascularization in IR on 6/1 Plan Serial neuro checks Systolic blood pressure goal less than 140 for now status post IR Further secondary stroke prevention and neurodiagnostics per stroke team PAD protocol, RA SS goal 0 to -1  Ventilator dependent status for airway protection status post CVA Plan Follow-up ABG and chest x-ray VAP bundle PAD protocol  Baseline dyspnea-->per husband had been attributed to the cancer Plan CXR for now   Mild leukocytosis, likely reactive Plan Trend CBC and fever curve  Recent new formal dx of metastatic breast cancer ([place])  Plan F/u out pt   Best practice:  Diet: NPO Pain/Anxiety/Delirium protocol (if indicated): 6/1 VAP protocol (if indicated): 6/1 DVT prophylaxis:  scd GI prophylaxis: ppi Glucose control: NA Mobility: BR Code Status: full code  Family Communication: updated  Disposition: critically ill due to acute left MCA CVA. Keep on vent; control BP; re-image. Proceed w/ assessment for weaning in am   Labs    CBC: Recent Labs  Lab 04/14/2019 0726  WBC 12.9*  NEUTROABS 9.5*  HGB 11.2*  HCT 35.3*  MCV 86.7  PLT 202    Basic Metabolic Panel: Recent Labs  Lab 04/10/2019 0726  NA 140  K 4.2  CL 110  CO2 22  GLUCOSE 115*  BUN 29*  CREATININE 1.09*  CALCIUM 9.1   GFR: Estimated Creatinine Clearance: 52.9 mL/min (A) (by C-G formula based on SCr of 1.09 mg/dL (H)). Recent Labs  Lab 04/13/2019 0726  WBC 12.9*    Liver Function Tests: Recent Labs  Lab 03/31/2019 0726  AST 25  ALT 22  ALKPHOS 90  BILITOT 0.5  PROT 7.2  ALBUMIN 3.6   No results for input(s): LIPASE, AMYLASE in the last 168 hours. No results for input(s): AMMONIA in the last 168 hours.  ABG No results found for: PHART, PCO2ART, PO2ART, HCO3, TCO2, ACIDBASEDEF, O2SAT   Coagulation Profile: Recent Labs  Lab 04/20/2019 0726  INR 1.1    Cardiac Enzymes: No results for input(s): CKTOTAL, CKMB, CKMBINDEX, TROPONINI in the last 168 hours.  HbA1C: Hgb A1c MFr Bld  Date/Time Value Ref Range Status  12/19/2018 02:41 PM 5.9 (H) 4.8 - 5.6 % Final    Comment:    (NOTE) Pre diabetes:          5.7%-6.4% Diabetes:              >6.4% Glycemic control for   <7.0% adults with diabetes     CBG: Recent Labs  Lab 03/29/2019 0721 04/04/2019 1008 03/29/2019 1204  GLUCAP 106* 68* 95    Review of Systems:   Not able   Past Medical History  She,  has a past medical history of COPD (chronic obstructive pulmonary disease) ([place]), Hypercholesteremia, IBS (irritable bowel syndrome), and Primary breast cancer with metastasis to other site [facility]).   Surgical History    Past Surgical History:  Procedure Laterality Date   LOOP RECORDER INSERTION N/A 01/01/2019   Procedure: LOOP RECORDER INSERTION;  Surgeon: Isaias Cowman, MD;  Location: [place] [facility];  Service: Cardiovascular;  Laterality: N/A;   TEE WITHOUT CARDIOVERSION N/A 12/21/2018   Procedure: TRANSESOPHAGEAL ECHOCARDIOGRAM (TEE);  Surgeon: Teodoro Spray, MD;  Location: [facility] ORS;  Service: Cardiovascular;  Laterality: N/A;     Social History   reports that she has never smoked. She has never used smokeless tobacco. She reports previous alcohol use.   Family History   Her family history includes Stroke in her daughter.   Allergies No Known Allergies   Home Medications  Prior to Admission medications   Medication Sig Start Date End Date Taking? Authorizing Provider  aspirin 81 MG chewable tablet Chew 1 tablet (81 mg total) by mouth daily. 12/21/18   Bettey Costa, MD  calcium carbonate (OSCAL) 1500 (600 Ca) MG TABS tablet Take 600 mg of elemental calcium by mouth daily.    [provider]  Cholecalciferol (VITAMIN D) 50 MCG (2000 UT) tablet Take 2,000 Units by mouth daily.    [provider]  Fiber POWD Take 5 g by mouth daily.    [provider]  fluticasone (FLONASE) 50 MCG/ACT nasal spray Place 1-2 sprays into both nostrils daily.  [provider]  mometasone (NASONEX) 50 MCG/ACT nasal spray Place 2 sprays into the nose daily as needed (nasal congestion).    [provider]  mometasone-formoterol (DULERA) 100-5 MCG/ACT AERO Inhale 2 puffs into the lungs 2 (two) times daily.    [provider]  montelukast (SINGULAIR) 10 MG tablet Take 10 mg by mouth every evening.    [provider]  omeprazole (PRILOSEC) 40 MG capsule Take 40 mg by mouth daily.    [provider]  simvastatin (ZOCOR) 40 MG tablet Take 1 tablet (40 mg total) by mouth every evening. 12/21/18   Bettey Costa, MD  tiotropium (SPIRIVA) 18 MCG inhalation capsule Place 18 mcg into inhaler and inhale daily.    [provider]  valACYclovir (VALTREX) 1000 MG tablet Take 1 g by mouth every 8 (eight) hours. 02/21/19   [provider]     Critical care time: na     Erick Colace ACNP-BC [place] Pager # [phone] OR # [phone] if no answer

## 2019-03-25 NOTE — ED Notes (Signed)
CBG 68, RN notified. 

## 2019-03-26 ENCOUNTER — Inpatient Hospital Stay (HOSPITAL_COMMUNITY): Payer: Medicare Other

## 2019-03-26 ENCOUNTER — Encounter (HOSPITAL_COMMUNITY): Payer: Self-pay | Admitting: Interventional Radiology

## 2019-03-26 DIAGNOSIS — R509 Fever, unspecified: Secondary | ICD-10-CM

## 2019-03-26 DIAGNOSIS — R918 Other nonspecific abnormal finding of lung field: Secondary | ICD-10-CM

## 2019-03-26 DIAGNOSIS — Z8673 Personal history of transient ischemic attack (TIA), and cerebral infarction without residual deficits: Secondary | ICD-10-CM

## 2019-03-26 DIAGNOSIS — I6602 Occlusion and stenosis of left middle cerebral artery: Secondary | ICD-10-CM

## 2019-03-26 DIAGNOSIS — I639 Cerebral infarction, unspecified: Secondary | ICD-10-CM

## 2019-03-26 DIAGNOSIS — D72829 Elevated white blood cell count, unspecified: Secondary | ICD-10-CM

## 2019-03-26 DIAGNOSIS — C50919 Malignant neoplasm of unspecified site of unspecified female breast: Secondary | ICD-10-CM

## 2019-03-26 DIAGNOSIS — I6300 Cerebral infarction due to thrombosis of unspecified precerebral artery: Secondary | ICD-10-CM

## 2019-03-26 DIAGNOSIS — Z978 Presence of other specified devices: Secondary | ICD-10-CM

## 2019-03-26 LAB — COMPREHENSIVE METABOLIC PANEL WITH GFR
ALT: 19 U/L (ref 0–44)
AST: 23 U/L (ref 15–41)
Albumin: 2.9 g/dL — ABNORMAL LOW (ref 3.5–5.0)
Alkaline Phosphatase: 79 U/L (ref 38–126)
Anion gap: 9 (ref 5–15)
BUN: 20 mg/dL (ref 8–23)
CO2: 20 mmol/L — ABNORMAL LOW (ref 22–32)
Calcium: 8.4 mg/dL — ABNORMAL LOW (ref 8.9–10.3)
Chloride: 109 mmol/L (ref 98–111)
Creatinine, Ser: 1.08 mg/dL — ABNORMAL HIGH (ref 0.44–1.00)
GFR calc Af Amer: >60 mL/min
GFR calc non Af Amer: 54 mL/min — ABNORMAL LOW
Glucose, Bld: 120 mg/dL — ABNORMAL HIGH (ref 70–99)
Potassium: 4.6 mmol/L (ref 3.5–5.1)
Sodium: 138 mmol/L (ref 135–145)
Total Bilirubin: 0.5 mg/dL (ref 0.3–1.2)
Total Protein: 5.6 g/dL — ABNORMAL LOW (ref 6.5–8.1)

## 2019-03-26 LAB — BLOOD GAS, ARTERIAL
Acid-base deficit: 3.8 mmol/L — ABNORMAL HIGH (ref 0.0–2.0)
Bicarbonate: 20.2 mmol/L (ref 20.0–28.0)
Drawn by: 31777
FIO2: 40
MECHVT: 470 mL
O2 Saturation: 98.6 %
PEEP: 5 cmH2O
Patient temperature: 98.6
RATE: 16 resp/min
pCO2 arterial: 32.9 mmHg (ref 32.0–48.0)
pH, Arterial: 7.405 (ref 7.350–7.450)
pO2, Arterial: 115 mmHg — ABNORMAL HIGH (ref 83.0–108.0)

## 2019-03-26 LAB — CBC WITH DIFFERENTIAL/PLATELET
Abs Immature Granulocytes: 0.06 K/uL (ref 0.00–0.07)
Basophils Absolute: 0 K/uL (ref 0.0–0.1)
Basophils Relative: 0 %
Eosinophils Absolute: 0 K/uL (ref 0.0–0.5)
Eosinophils Relative: 0 %
HCT: 31.4 % — ABNORMAL LOW (ref 36.0–46.0)
Hemoglobin: 9.8 g/dL — ABNORMAL LOW (ref 12.0–15.0)
Immature Granulocytes: 1 %
Lymphocytes Relative: 8 %
Lymphs Abs: 1 K/uL (ref 0.7–4.0)
MCH: 27.2 pg (ref 26.0–34.0)
MCHC: 31.2 g/dL (ref 30.0–36.0)
MCV: 87.2 fL (ref 80.0–100.0)
Monocytes Absolute: 0.8 K/uL (ref 0.1–1.0)
Monocytes Relative: 6 %
Neutro Abs: 10.3 K/uL — ABNORMAL HIGH (ref 1.7–7.7)
Neutrophils Relative %: 85 %
Platelets: 251 K/uL (ref 150–400)
RBC: 3.6 MIL/uL — ABNORMAL LOW (ref 3.87–5.11)
RDW: 15 % (ref 11.5–15.5)
WBC: 12.1 K/uL — ABNORMAL HIGH (ref 4.0–10.5)
nRBC: 0 % (ref 0.0–0.2)

## 2019-03-26 LAB — LIPID PANEL
Cholesterol: 97 mg/dL (ref 0–200)
HDL: 39 mg/dL — ABNORMAL LOW (ref >40)
LDL Cholesterol: 38 mg/dL (ref 0–99)
Total CHOL/HDL Ratio: 2.5 RATIO
Triglycerides: 98 mg/dL (ref <150)
VLDL: 20 mg/dL (ref 0–40)

## 2019-03-26 LAB — HIV ANTIBODY (ROUTINE TESTING W REFLEX): HIV Screen 4th Generation wRfx: NONREACTIVE

## 2019-03-26 LAB — HEMOGLOBIN A1C
Hgb A1c MFr Bld: 5.7 % — ABNORMAL HIGH (ref 4.8–5.6)
Mean Plasma Glucose: 116.89 mg/dL

## 2019-03-26 MED ORDER — LORAZEPAM 2 MG/ML IJ SOLN
1.0000 mg | Freq: Once | INTRAMUSCULAR | Status: AC | PRN
  Administered 2019-03-26: 12:00:00 1 mg via INTRAVENOUS
  Filled 2019-03-26: qty 1

## 2019-03-26 MED ORDER — CHLORHEXIDINE GLUCONATE CLOTH 2 % EX PADS
6.0000 | MEDICATED_PAD | Freq: Every day | CUTANEOUS | Status: DC
  Administered 2019-03-26 – 2019-03-29 (×4): 6 via TOPICAL

## 2019-03-26 MED ORDER — CHLORHEXIDINE GLUCONATE 0.12 % MT SOLN
15.0000 mL | Freq: Two times a day (BID) | OROMUCOSAL | Status: DC
  Administered 2019-03-26 – 2019-04-01 (×12): 15 mL via OROMUCOSAL
  Filled 2019-03-26 (×6): qty 15

## 2019-03-26 MED ORDER — ORAL CARE MOUTH RINSE: 15.0000 mL | Freq: Two times a day (BID) | OROMUCOSAL | Status: DC

## 2019-03-26 MED ORDER — ORAL CARE MOUTH RINSE
15.0000 mL | Freq: Four times a day (QID) | OROMUCOSAL | Status: DC
  Administered 2019-03-26 – 2019-04-01 (×21): 15 mL via OROMUCOSAL

## 2019-03-26 MED ORDER — DEXMEDETOMIDINE HCL IN NACL 200 MCG/50ML IV SOLN: 0.4000 ug/kg/h | INTRAVENOUS | Status: DC

## 2019-03-26 MED ORDER — FENTANYL CITRATE (PF) 100 MCG/2ML IJ SOLN: 25.0000 ug | INTRAMUSCULAR | Status: DC | PRN

## 2019-03-26 NOTE — Progress Notes (Signed)
HR sustaining in the 110s-120s since extubation at around 1600, MD notified, no new orders at this time

## 2019-03-26 NOTE — Progress Notes (Signed)
SLP Cancellation Note  Patient Details Name: Megan Hancock MRN: [id number] DOB: [birth year]   Cancelled treatment:       Reason Eval/Treat Not Completed: Medical issues which prohibited therapy;Patient not medically ready(Pt currently intubated. SLP will follow up. )  Nelson Noone I. Hardin Negus, [place], [place] Office number [phone] Pager [phone]  Horton Marshall 03/26/2019, 8:44 AM

## 2019-03-26 NOTE — Progress Notes (Addendum)
IR rounding note via telephone per new regulations. Spoke with Amy, RN.  Patient with history of acute CVA s/p emergent cerebral arteriogram with mechanical thrombectomy of left MCA inferior division occlusion achieving a TICI 2b revascularization 04/19/2019 by Dr. Estanislado Pandy.  Intubated without sedation. She does not follow commands. PERRL bilaterally. Can spontaneously move left side, RUE withdraws from pain, minimal movements of RLE. Distal pulses 1+ bilaterally. Right groin incision soft without active bleeding or hematoma.  Plan for MRI today- possible extubation post MRI (RN reports patient is very claustrophobic). Right groin stable. Appreciate and agree with neurology management. IR to follow.   Megan Graff Ulices Maack, PA-C 03/26/2019, 9:45 AM

## 2019-03-26 NOTE — Progress Notes (Signed)
Pt transported to MRI via ventilator. No complications noted. Notified Robbie Lis RRT, RCP of pt's return.

## 2019-03-26 NOTE — Progress Notes (Signed)
Assisted tele visit to patient with husband.  Martine Bleecker Parker, RN  

## 2019-03-26 NOTE — Procedures (Signed)
Extubation Procedure Note  Patient Details:   Name: Megan Hancock DOB: Mar 10, 1951 MRN: [id number]   Airway Documentation:    Vent end date: 03/26/19 Vent end time: 1540   Evaluation  O2 sats: stable throughout Complications: No apparent complications Patient did tolerate procedure well. Bilateral Breath Sounds: Rhonchi   No   Positive cuff leak noted. Patient placed on 4L [place] with humidity. No stridor noted. Patient not following commands.   [place] 03/26/2019, 3:51 PM

## 2019-03-26 NOTE — Progress Notes (Signed)
STROKE TEAM PROGRESS NOTE   INTERVAL HISTORY Patient still intubated, but tolerating with weaning trials.  Pending MRI.  Still has left gaze preference, not following commands, right neglect and right hemiplegia. I had long discussion with husband over the phone, updated pt current condition, treatment plan and potential prognosis. He expressed understanding and appreciation.  Patient and her husband is currently visiting [place] from [place].  Vitals:   03/26/19 1133 03/26/19 1200 03/26/19 1300 03/26/19 1400  BP:  (!) 147/84 129/70 (!) 141/70  Pulse:  100 68   Resp:  15 17 16   Temp:  99.7 F (37.6 C)    TempSrc:  Axillary    SpO2: 100% 100% 93%   Height:        CBC:  Recent Labs  Lab 04/16/2019 0726 04/06/2019 1446 03/26/19 0146  WBC 12.9*  --  12.1*  NEUTROABS 9.5*  --  10.3*  HGB 11.2* 9.5* 9.8*  HCT 35.3* 28.0* 31.4*  MCV 86.7  --  87.2  PLT 256  --  778    Basic Metabolic Panel:  Recent Labs  Lab 04/10/2019 0726 04/20/2019 1446 03/26/19 0146  NA 140 137 138  K 4.2 4.5 4.6  CL 110  --  109  CO2 22  --  20*  GLUCOSE 115*  --  120*  BUN 29*  --  20  CREATININE 1.09*  --  1.08*  CALCIUM 9.1  --  8.4*   Lipid Panel:     Component Value Date/Time   CHOL 97 03/26/2019 0146   TRIG 98 03/26/2019 0146   HDL 39 (L) 03/26/2019 0146   CHOLHDL 2.5 03/26/2019 0146   VLDL 20 03/26/2019 0146   LDLCALC 38 03/26/2019 0146   HgbA1c:  Lab Results  Component Value Date   HGBA1C 5.7 (H) 03/26/2019   Urine Drug Screen: No results found for: LABOPIA, COCAINSCRNUR, LABBENZ, AMPHETMU, THCU, LABBARB  Alcohol Level No results found for: ETH  IMAGING Ct Angio Head W Or Wo Contrast  Result Date: 04/03/2019 CLINICAL DATA:  68 year old female code stroke with evidence of acute left MCA infarct on plain head CT today. Right MCA territory infarct in February. EXAM: CT ANGIOGRAPHY HEAD AND NECK CT PERFUSION BRAIN TECHNIQUE: Multidetector CT imaging of the head and neck was  performed using the standard protocol during bolus administration of intravenous contrast. Multiplanar CT image reconstructions and MIPs were obtained to evaluate the vascular anatomy. Carotid stenosis measurements (when applicable) are obtained utilizing NASCET criteria, using the distal internal carotid diameter as the denominator. Multiphase CT imaging of the brain was performed following IV bolus contrast injection. Subsequent parametric perfusion maps were calculated using RAPID software. CONTRAST:  116mL ISOVUE-370 IOPAMIDOL (ISOVUE-370) INJECTION 76% COMPARISON:  Plain head CT 0734 hours today. Brain MRI and intracranial MRA 12/20/2018. FINDINGS: CT Brain Perfusion Findings: ASPECTS: 8 CBF (<30%) Volume: 65mL Perfusion (Tmax>6.0s) volume: 88mL, hypoperfusion index 0.5 Mismatch Volume: 36mL Infarction Location:Left MCA, primarily posterior division. CTA NECK Skeleton: No acute osseous abnormality identified. Upper chest: Partially visible left mediastinal mass on series 4, image 1. this measures at least 3-4 centimeters. Centrilobular emphysema. Other neck: Abnormal lymph nodes at the thoracic inlet, level 4 individually up to 14 millimeters short axis. The thyroid is also abnormal and appears inseparable from the lower strap muscles on series 4, image 30. There are additional abnormal cervical lymph nodes including right level 3 nodes up to 13 millimeter short axis and subcutaneous soft tissue nodules up to 17 millimeters (series 4,  image 60 and 69. No laryngeal or pharyngeal mass. Aortic arch: Abutted by tumor. 3 vessel arch configuration. Great vessel origins are patent with no stenosis. Right carotid system: Minimal atherosclerosis at the right carotid bifurcation, no stenosis. Left carotid system: Mild soft and calcified plaque at the left ICA origin with no stenosis. Vertebral arteries: Mild-to-moderate right vertebral artery origin stenosis due to partially calcified plaque. The right vertebral artery  is tortuous but patent to the skull base without additional stenosis. No proximal left subclavian artery or left vertebral artery origin stenosis. Patent left vertebral artery to the skull base without stenosis. CTA HEAD Posterior circulation: No distal vertebral stenosis. Patent PICA origins, basilar artery, AICA, SCA and PCA origins. Posterior communicating arteries are diminutive or absent. Bilateral PCA branches are within normal limits. Anterior circulation: Both ICA siphons are patent. On the left there is mild calcified plaque without stenosis. On the right there is mild calcified plaque without stenosis. The carotid termini, MCA and ACA origins are patent and normal. Anterior communicating artery and bilateral ACA branches are within normal limits. Left MCA M1 is patent. The left MCA bifurcation is patent. The dominant posterior left M2 branch is occluded just before its bifurcation about 6 or 7 millimeters from the left MCA bifurcation. See series 10, image 136. The left MCA anterior division appears stable compared to MRA earlier this year. Right MCA M1 and bifurcation are patent without stenosis. Right MCA branches appear patent although there is a moderate to severe stenosis of a distal M2 or M3 branch on series 11, image 13. Venous sinuses: Early contrast timing, not well evaluated. Anatomic variants: Dominant right vertebral artery. Review of the MIP images confirms the above findings IMPRESSION: 1. 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 2. Positive for Left MCA M2 emergent large vessel occlusion with estimated core infarct of 31 mL and penumbra 62 mL with hypoperfusion index of 0.5. 3. The above was discussed by telephone with Dr. Myrene Buddy in the ED on 04/14/2019 at 0833 hours. 4. No carotid artery stenosis. Mild to moderate right vertebral artery origin stenosis, otherwise negative posterior circulation.  There is a right MCA M3 stenosis in the posterior division. Electronically Signed   By: Genevie Ann M.D.   On: 03/29/2019 08:37   Ct Angio Neck W Or Wo Contrast  Result Date: 04/04/2019 CLINICAL DATA:  68 year old female code stroke with evidence of acute left MCA infarct on plain head CT today. Right MCA territory infarct in February. EXAM: CT ANGIOGRAPHY HEAD AND NECK CT PERFUSION BRAIN TECHNIQUE: Multidetector CT imaging of the head and neck was performed using the standard protocol during bolus administration of intravenous contrast. Multiplanar CT image reconstructions and MIPs were obtained to evaluate the vascular anatomy. Carotid stenosis measurements (when applicable) are obtained utilizing NASCET criteria, using the distal internal carotid diameter as the denominator. Multiphase CT imaging of the brain was performed following IV bolus contrast injection. Subsequent parametric perfusion maps were calculated using RAPID software. CONTRAST:  148mL ISOVUE-370 IOPAMIDOL (ISOVUE-370) INJECTION 76% COMPARISON:  Plain head CT 0734 hours today. Brain MRI and intracranial MRA 12/20/2018. FINDINGS: CT Brain Perfusion Findings: ASPECTS: 8 CBF (<30%) Volume: 5mL Perfusion (Tmax>6.0s) volume: 27mL, hypoperfusion index 0.5 Mismatch Volume: 61mL Infarction Location:Left MCA, primarily posterior division. CTA NECK Skeleton: No acute osseous abnormality identified. Upper chest: Partially visible left mediastinal mass on series 4, image 1. this measures at least 3-4  centimeters. Centrilobular emphysema. Other neck: Abnormal lymph nodes at the thoracic inlet, level 4 individually up to 14 millimeters short axis. The thyroid is also abnormal and appears inseparable from the lower strap muscles on series 4, image 30. There are additional abnormal cervical lymph nodes including right level 3 nodes up to 13 millimeter short axis and subcutaneous soft tissue nodules up to 17 millimeters (series 4, image 60 and 69. No laryngeal or  pharyngeal mass. Aortic arch: Abutted by tumor. 3 vessel arch configuration. Great vessel origins are patent with no stenosis. Right carotid system: Minimal atherosclerosis at the right carotid bifurcation, no stenosis. Left carotid system: Mild soft and calcified plaque at the left ICA origin with no stenosis. Vertebral arteries: Mild-to-moderate right vertebral artery origin stenosis due to partially calcified plaque. The right vertebral artery is tortuous but patent to the skull base without additional stenosis. No proximal left subclavian artery or left vertebral artery origin stenosis. Patent left vertebral artery to the skull base without stenosis. CTA HEAD Posterior circulation: No distal vertebral stenosis. Patent PICA origins, basilar artery, AICA, SCA and PCA origins. Posterior communicating arteries are diminutive or absent. Bilateral PCA branches are within normal limits. Anterior circulation: Both ICA siphons are patent. On the left there is mild calcified plaque without stenosis. On the right there is mild calcified plaque without stenosis. The carotid termini, MCA and ACA origins are patent and normal. Anterior communicating artery and bilateral ACA branches are within normal limits. Left MCA M1 is patent. The left MCA bifurcation is patent. The dominant posterior left M2 branch is occluded just before its bifurcation about 6 or 7 millimeters from the left MCA bifurcation. See series 10, image 136. The left MCA anterior division appears stable compared to MRA earlier this year. Right MCA M1 and bifurcation are patent without stenosis. Right MCA branches appear patent although there is a moderate to severe stenosis of a distal M2 or M3 branch on series 11, image 13. Venous sinuses: Early contrast timing, not well evaluated. Anatomic variants: Dominant right vertebral artery. Review of the MIP images confirms the above findings IMPRESSION: 1. Partially visible malignancy in the upper chest and neck,  including lymphadenopathy and a left mediastinal mass which is at least 3-4 cm. 2. Positive for Left MCA M2 emergent large vessel occlusion with estimated core infarct of 31 mL and penumbra 62 mL with hypoperfusion index of 0.5. 3. The above was discussed by telephone with Dr. Myrene Buddy in the ED on 04/18/2019 at 0833 hours. 4. No carotid artery stenosis. Mild to moderate right vertebral artery origin stenosis, otherwise negative posterior circulation. There is a right MCA M3 stenosis in the posterior division. Electronically Signed   By: Genevie Ann M.D.   On: 04/11/2019 08:37   Mr Brain Wo Contrast  Result Date: 03/26/2019 CLINICAL DATA:  Status post CVA with thrombectomy. RIGHT-sided weakness. EXAM: MRI HEAD WITHOUT CONTRAST TECHNIQUE: Multiplanar, multiecho pulse sequences of the brain and surrounding structures were obtained without intravenous contrast. COMPARISON:  CTA and CTP 04/04/2019. Catheter angiogram 04/21/2019 with postprocedure CT. MR brain and MRA 12/20/2018 FINDINGS: Brain: Large area of restricted diffusion, corresponding low ADC, largely corresponding to the posterior inferior division of the LEFT MCA. The area of infarction roughly corresponds to the penumbra observed on prior CTP, slightly greater. There is involvement of the insula, temporal lobe, frontal lobe, parietal lobe, and occipital lobe, as well as periventricular white matter Ovoid parenchymal hemorrhage, epicenter LEFT insula and external capsule, approximate measurements 27 x  44 x 25 mm, corresponding volume 15 mL. Subarachnoid hemorrhage within the LEFT sylvian fissure and over the LEFT cerebral convexity is also noted, and corresponds with the postprocedure CT. Mild mass effect LEFT-to-RIGHT at the septum pellucidum level, 3 mm. Chronic RIGHT parietal infarction with encephalomalacia, was acute in February 2020. Vascular: Proximal flow voids are maintained, including LEFT M1 MCA. Skull and upper cervical spine: Unremarkable calvarium and  skull base. Sebaceous cyst RIGHT frontal scalp. Sinuses/Orbits: Negative sinuses.  BILATERAL cataract extraction. Other: None. IMPRESSION: Large area of acute infarction roughly corresponding to the posterior/inferior division LEFT MCA, proximal M2 occlusion. Intracranial hemorrhage following reperfusion therapy consisting of both parenchymal hematoma occupying roughly 30% of the infarcted tissue with mass effect, as well as subarachnoid hemorrhage. Heidelburg classification PH2 and 3C. 3 mm LEFT-to-RIGHT mass effect at septum pellucidum. Continued surveillance is warranted. Electronically Signed   By: Staci Righter M.D.   On: 03/26/2019 13:31   Ct Cerebral Perfusion W Contrast  Result Date: 03/30/2019 CLINICAL DATA:  68 year old female code stroke with evidence of acute left MCA infarct on plain head CT today. Right MCA territory infarct in February. EXAM: CT ANGIOGRAPHY HEAD AND NECK CT PERFUSION BRAIN TECHNIQUE: Multidetector CT imaging of the head and neck was performed using the standard protocol during bolus administration of intravenous contrast. Multiplanar CT image reconstructions and MIPs were obtained to evaluate the vascular anatomy. Carotid stenosis measurements (when applicable) are obtained utilizing NASCET criteria, using the distal internal carotid diameter as the denominator. Multiphase CT imaging of the brain was performed following IV bolus contrast injection. Subsequent parametric perfusion maps were calculated using RAPID software. CONTRAST:  170mL ISOVUE-370 IOPAMIDOL (ISOVUE-370) INJECTION 76% COMPARISON:  Plain head CT 0734 hours today. Brain MRI and intracranial MRA 12/20/2018. FINDINGS: CT Brain Perfusion Findings: ASPECTS: 8 CBF (<30%) Volume: 33mL Perfusion (Tmax>6.0s) volume: 80mL, hypoperfusion index 0.5 Mismatch Volume: 52mL Infarction Location:Left MCA, primarily posterior division. CTA NECK Skeleton: No acute osseous abnormality identified. Upper chest: Partially visible left  mediastinal mass on series 4, image 1. this measures at least 3-4 centimeters. Centrilobular emphysema. Other neck: Abnormal lymph nodes at the thoracic inlet, level 4 individually up to 14 millimeters short axis. The thyroid is also abnormal and appears inseparable from the lower strap muscles on series 4, image 30. There are additional abnormal cervical lymph nodes including right level 3 nodes up to 13 millimeter short axis and subcutaneous soft tissue nodules up to 17 millimeters (series 4, image 60 and 69. No laryngeal or pharyngeal mass. Aortic arch: Abutted by tumor. 3 vessel arch configuration. Great vessel origins are patent with no stenosis. Right carotid system: Minimal atherosclerosis at the right carotid bifurcation, no stenosis. Left carotid system: Mild soft and calcified plaque at the left ICA origin with no stenosis. Vertebral arteries: Mild-to-moderate right vertebral artery origin stenosis due to partially calcified plaque. The right vertebral artery is tortuous but patent to the skull base without additional stenosis. No proximal left subclavian artery or left vertebral artery origin stenosis. Patent left vertebral artery to the skull base without stenosis. CTA HEAD Posterior circulation: No distal vertebral stenosis. Patent PICA origins, basilar artery, AICA, SCA and PCA origins. Posterior communicating arteries are diminutive or absent. Bilateral PCA branches are within normal limits. Anterior circulation: Both ICA siphons are patent. On the left there is mild calcified plaque without stenosis. On the right there is mild calcified plaque without stenosis. The carotid termini, MCA and ACA origins are patent and normal. Anterior communicating artery and  bilateral ACA branches are within normal limits. Left MCA M1 is patent. The left MCA bifurcation is patent. The dominant posterior left M2 branch is occluded just before its bifurcation about 6 or 7 millimeters from the left MCA bifurcation. See  series 10, image 136. The left MCA anterior division appears stable compared to MRA earlier this year. Right MCA M1 and bifurcation are patent without stenosis. Right MCA branches appear patent although there is a moderate to severe stenosis of a distal M2 or M3 branch on series 11, image 13. Venous sinuses: Early contrast timing, not well evaluated. Anatomic variants: Dominant right vertebral artery. Review of the MIP images confirms the above findings IMPRESSION: 1. 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 2. Positive for Left MCA M2 emergent large vessel occlusion with estimated core infarct of 31 mL and penumbra 62 mL with hypoperfusion index of 0.5. 3. The above was discussed by telephone with Dr. Myrene Buddy in the ED on 04/22/2019 at 0833 hours. 4. No carotid artery stenosis. Mild to moderate right vertebral artery origin stenosis, otherwise negative posterior circulation. There is a right MCA M3 stenosis in the posterior division. Electronically Signed   By: Genevie Ann M.D.   On: 04/11/2019 08:37   Portable Chest Xray  Result Date: 03/26/2019 CLINICAL DATA:  Check endotracheal tube placement EXAM: PORTABLE CHEST 1 VIEW COMPARISON:  04/05/2019 FINDINGS: Cardiac shadow is stable. Loop recorder is seen. Endotracheal tube is noted in satisfactory position. Fullness in the left hilum is again identified but slightly less prominent. Nonemergent CT of the chest is recommended for further evaluation when the patient's condition improves. The lungs are well aerated without focal infiltrate. No bony abnormality is noted. IMPRESSION: Persistent fullness in the left hilum. CT is again recommended for further evaluation when clinically able. Electronically Signed   By: Inez Catalina M.D.   On: 03/26/2019 07:50   Portable Chest X-ray  Result Date: 04/21/2019 CLINICAL DATA:  Evaluate ET tube placement EXAM: PORTABLE CHEST 1 VIEW  COMPARISON:  None FINDINGS: Endotracheal tube tip is above the carina. Normal heart size. Within the left hilar region there is a mass measuring approximately 6.1 cm. Coarsened interstitial markings are identified bilaterally, likely reflecting emphysema. No superimposed airspace consolidation. IMPRESSION: 1. Left perihilar lung mass is identified. Recommend further evaluation with contrast enhanced CT of the chest. 2. Emphysema suspected. Electronically Signed   By: Kerby Moors M.D.   On: 04/13/2019 15:36   Ct Head Code Stroke Wo Contrast  Addendum Date: 04/10/2019   ADDENDUM REPORT: 04/22/2019 07:51 ADDENDUM: Study discussed by telephone with Dr. Duffy Bruce on 04/15/2019 at 0749 hours. Electronically Signed   By: Genevie Ann M.D.   On: 04/21/2019 07:51   Result Date: 03/26/2019 CLINICAL DATA:  Code stroke. 68 year old female with right side numbness upon waking. EXAM: CT HEAD WITHOUT CONTRAST TECHNIQUE: Contiguous axial images were obtained from the base of the skull through the vertex without intravenous contrast. COMPARISON:  Brain MRI, intracranial MRA and head CT 12/20/2018. FINDINGS: Brain: Encephalomalacia in the right posterior frontal and parietal lobe corresponding to the February infarct. No associated hemorrhage or mass effect. Mild ex vacuo enlargement of the right lateral ventricle. New cytotoxic edema in the left MCA territory most apparent on series 2, image 13 at the posterior left superior temporal gyrus. No associated hemorrhage or mass effect. Involvement of the posterior left insula. Stable gray-white matter differentiation  elsewhere. Normal basilar cisterns. Vascular: Mild Calcified atherosclerosis at the skull base. No suspicious left MCA M1 density although there might be an abnormal hyperdense left MCA branch on sagittal image 41. Skull: No acute osseous abnormality identified. Previous right suboccipital craniotomy. Sinuses/Orbits: Visualized paranasal sinuses and mastoids are stable  and well pneumatized. Other: New circumscribed low-density scalp soft tissue nodule along the right forehead on series 3, image 12 has a benign appearance, perhaps sebaceous cyst. There is a similar new smaller right scalp lesion on series 3, image 22. negative visible orbits. ASPECTS [facility] Stroke Program Early CT Score) - Ganglionic level infarction (caudate, lentiform nuclei, internal capsule, insula, M1-M3 cortex): 5 - Supraganglionic infarction (M4-M6 cortex): 3 Total score (0-10 with 10 being normal): 8 IMPRESSION: 1. Acute Left MCA infarct most apparent at the left superior temporal gyrus. No associated hemorrhage or mass effect. ASPECTS is 8. 2. No left M1 hyperdensity but possible hyperdense left MCA branch. 3. Expected evolution of the posterior right MCA infarct since February. Electronically Signed: By: Genevie Ann M.D. On: 04/14/2019 07:46   Cerebral angiogram  partial revascularization of occluded Lt MCA  Inf division with x 2 passes with 63mm x 28mm embotrap retriever device  And penumbra aspiration achieving a TICI 2a/2b revascularization   2D Echocardiogram  1. The left ventricle has normal systolic function with an ejection fraction of 60-65%. The cavity size was normal. Left ventricular diastolic Doppler parameters are consistent with pseudonormalization. No evidence of left ventricular regional wall  motion abnormalities.  2. The right ventricle has normal systolic function. The cavity was normal. There is no increase in right ventricular wall thickness.  3. The pericardial effusion is posterior to the left ventricle.  4. Trivial pericardial effusion is present.  5. There is mild to moderate mitral annular calcification present.  6. The aortic valve is tricuspid. Moderate stenosis of the aortic valve.  7. The aortic root and ascending aorta are normal in size and structure.  8. The interatrial septum was not well visualized.   PHYSICAL EXAM  Temp:  [98.5 F (36.9 C)-99.7 F  (37.6 C)] 99.7 F (37.6 C) (06/02 1200) Pulse Rate:  [68-112] 112 (06/02 1454) Resp:  [12-22] 17 (06/02 1454) BP: (94-147)/(56-111) 129/76 (06/02 1454) SpO2:  [93 %-100 %] 100 % (06/02 1454) FiO2 (%):  [40 %] 40 % (06/02 1454)  General - Well nourished, well developed, intubated off sedation.  Ophthalmologic - fundi not visualized due to noncooperation.  Cardiovascular - Regular rate and rhythm.  Neuro - intubated off sedation, eyes open, not following commands except possible close and open eyes on command. Eyes in left gaze position, inconsistently blinking to visual threat bilaterally, doll's eyes present, not tracking on the right, PERRL. Corneal reflex present, gag and cough present. Breathing over the vent.  Facial symmetry not able to test due to ET tube.  Tongue midline in mouth. On pain stimulation, LUE 2+/5 but BLE 2-/5 with mild withdraw, no movement of RUE. DTR diminished and no babinski. Sensation, coordination and gait not tested.   ASSESSMENT/PLAN [name] is a 68 y.o. 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  Out of the TPA window.  Found to have a left M2 LVO transferred to [facility] and taken to IR.   Stroke:   L MCA infarct s/p parital IR L M2 with post revascularization hemorrhage and cerebral edema -  embolic could be secondary to hypercoagulable state from metastatic breast cancer   Code Stroke CT head Acute L MCA infarct. Possible hyperdense L MCA branch. Expected evolution poster R MCA infarct since Feb.  CTA head & neck partially visible malignancy upper chest and neck w/ lymphadenopathy and 3-4 cm  L mediastinal mass. L M2 ELVO  CT perfusion 42mL core infarct w/ 64mL penumbra and hypoperfusion index 0.5  Cerebral angio L MCA M2 occlusion with TICI 2a/2b revascularization   MRI large left MCA infarct due to proximal M2 occlusion.   Intracranial hemorrhage following reperfusion consisting of both IPH of 30% of infarcted tissue as well as SAH, pH 2 and 3C.   2D Echo EF 60-65%. No source of embolus   11/2018 TEE showed no PFO  Loop recorder placed 12/31/2008 interrogation requested  LDL 38  HgbA1c 5.7  SCDs for VTE prophylaxis  aspirin 81 mg daily prior to admission, now on No antithrombotic given post revascularization hemorrhage  Therapy recommendations: Pending  Disposition: Pending  Acute respiratory failure  COPD  Intubated for IR in ED   Plan extubation trial after MRI completed  CTA neck showed partially visible malignancy upper chest and neck w/ lymphadenopathy and 3-4 cm  L mediastinal mass.  CCM on board  Primary breast cancer with metastasis  new diagnosis, for evaluation by oncology on 03/27/2019 in [place]  CTA neck showed partially visible malignancy upper chest and neck w/ lymphadenopathy and 3-4 cm  L mediastinal mass.  Baseline dyspnea likely caused by metastatic disease  Hx stroke/TIA  05/3290 -embolic right MCA infarct with resultant short-term memory deficits and imbalance, TEE neg, loop recorder placed, put on aspirin and statin  Fever  T-max 100.6  UA and urine culture pending  Blood culture pending  CXR Persistent fullness in the left hilum  Blood pressure   Home meds: None  BP stable   BP control post IR 120-140 by IR x24 hours . BP goal < 160 given hemorrhagic conversion  Hyperlipidemia  Home meds: Zocor 40  LDL 38, goal < 70  Resume half-strength Zocor once able to swallow   Continue statin at discharge  Dysphagia  Secondary to stroke  Diet-n.p.o.  Speech to follow  On IVF  Other Stroke Risk Factors  Advanced age  History ETOH use  Family hx stroke (daughter)  Other Active Problems  Leukocytosis, likely reactive 12.1  Hospital day # 1  This patient is critically ill due to large left MCA stroke, hemorrhagic conversion,  respiratory failure, history of stroke, metastatic breast cancer, and fever and at significant risk of neurological worsening, death form recurrent stroke, hematoma expansion, cerebral edema, brain herniation, seizure, sepsis. This patient's care requires constant monitoring of vital signs, hemodynamics, respiratory and cardiac monitoring, review of multiple databases, neurological assessment, discussion with family, other specialists and medical decision making of high complexity. I spent 45 minutes of neurocritical care time in the care of this patient. I had long discussion with husband over the phone, updated pt current condition, treatment plan and potential prognosis.  He expressed understanding and appreciation.   Rosalin Hawking, MD PhD Stroke Neurology 03/26/2019 5:26 PM   To contact Stroke Continuity provider, please refer to [email or web address] After hours, contact General Neurology

## 2019-03-26 NOTE — Consult Note (Signed)
NAME:  Megan Hancock, MRN:  [id number], DOB:  22-Nov-1950, LOS: 1 ADMISSION DATE:  04/05/2019, CONSULTATION DATE:  6/1 REFERRING MD: Megan Hancock, CHIEF COMPLAINT:  Ventilator management s/p CVA   Brief History   68 year old female admitted on 6/1 after presenting acutely with right-sided weakness and expressive aphasia.  Diagnostic evaluation consistent with left MCA stroke.  She was outside TPA window & also has active malignancy, went to neuroradiology where she underwent partial revascularization of occluded left MCA.  Return to intensive care on ventilator  History of present illness   68 year old female with history as mentioned below.  Presented to the emergency room acutely via code stroke with acute right-sided weakness/hemiparesis and difficulty speaking.  Particularly exhibiting expressive aphasia.  Code stroke was called,CT brain showed early ischemic changes deemed outside TPA window due to timing.  She was intubated for airway protection, and transferred to the neuro interventional radiology suite where she underwent arteriogram and partial endovascular revascularization of the left MCA she will return to the intensive care on the ventilator pulmonary asked to assist with management  Past Medical History  Prior  MCA stroke (only deficits were short term memory and balance), metastatic breast cancer (recently dx'd but apparently widely metastatic-->was to be evaluated by onc on 6/3 in [place])  [name] Events   6/1 admitted.   Consults:  Critical care 6/1 Procedures:  Intubation 6/1 Cerebral angiogram with partial revascularization of occluded left MCA  Significant Diagnostic Tests:  CT brain 6/1: Partial visible malignancy in the upper chest and neck including lymphadenopathy and left mediastinal mass, positive for left MCA M2 emergent large vessel occlusion  Echo 6/1  1. The left ventricle has normal systolic function with an ejection fraction of 60-65%. The cavity  size was normal. Left ventricular diastolic Doppler parameters are consistent with pseudonormalization. No evidence of left ventricular regional wall  motion abnormalities.  2. The right ventricle has normal systolic function. The cavity was normal. There is no increase in right ventricular wall thickness.  3. The pericardial effusion is posterior to the left ventricle.  4. Trivial pericardial effusion is present.  5. There is mild to moderate mitral annular calcification present.  6. The aortic valve is tricuspid. Moderate stenosis of the aortic valve.  7. The aortic root and ascending aorta are normal in size and structure.  8. The interatrial septum was not well visualized.  Micro Data:  None  Antimicrobials:  None  Interim history/subjective:  More awake this AM, not following commands, RASS 0.    Objective   Blood pressure 140/81, pulse 86, temperature 99.7 F (37.6 C), temperature source Axillary, resp. rate 16, height 5\' 6"  (1.676 m), SpO2 100 %.    Vent Mode: PRVC FiO2 (%):  [40 %-80 %] 40 % Set Rate:  [16 bmp] 16 bmp Vt Set:  [470 mL] 470 mL PEEP:  [5 cmH20] 5 cmH20 Plateau Pressure:  [10 cmH20-16 cmH20] 10 cmH20   Intake/Output Summary (Last 24 hours) at 03/26/2019 0906 Last data filed at 03/26/2019 0700 Gross per 24 hour  Intake 1508.29 ml  Output 1335 ml  Net 173.29 ml   There were no vitals filed for this visit.  Examination: General: this is a 68 year old female currently sedated on vent  HENT: NCAT orally intubated. No JVD Lungs: clear decreased bases  Cardiovascular: RRR no MRG Abdomen: soft not tender  Extremities: warm. Toes slightly mottled bilaterally. Pulses are palp Neuro: sedated  GU: cl yellow   Resolved  Hospital Problem list     Assessment & Plan:  Acute left MCA CVA s/p partial revascularization in IR on 6/1 Plan -Serial neuro checks -Systolic blood pressure goal 120-140 for now status post IR -Further secondary stroke prevention and  neurodiagnostics per stroke team -PAD protocol, RA SS goal 0 to -1, will give some precedex today while awaiting MRI and 1 dose of ativan 1mg  pre-MRI as patient has significant claustrophobia   Ventilator dependent status for airway protection status post CVA Plan She should be fine for extubation trial after MRI, vent mechanics look good  Baseline dyspnea-->CT suggestive of metastatic disease as likely cause Plan Will evaluate once extubated  Mild leukocytosis, likely reactive Plan Trend CBC and fever curve, afebrile, stable  Recent new formal dx of metastatic breast cancer (Megan Hancock)  Plan F/u outpt   Best practice:  Diet: NPO, swallow screen once extubated Pain/Anxiety/Delirium protocol (if indicated): 6/1 VAP protocol (if indicated): 6/1 DVT prophylaxis: scd GI prophylaxis: ppi Glucose control: NA Mobility: BR Code Status: full code  Family Communication: will update later today Disposition: critically ill due to acute left MCA CVA on vent, awaiting MRI then will do SBT and extubation   34 minutes critical care time not including any separately billable procedures.  Erskine Emery MD [facility] [phone]

## 2019-03-26 NOTE — Progress Notes (Signed)
Transported off floor to MRI w/ SWOT RN and RT

## 2019-03-26 NOTE — Progress Notes (Signed)
OT Cancellation Note  Patient Details Name: Megan Hancock MRN: [id number] DOB: January 11, 1951   Cancelled Treatment:    Reason Eval/Treat Not Completed: Patient not medically ready;Active bedrest order.  Will reattempt  Lucille Passy, OTR/L [place] Pager [phone] Office [place], [place] 03/26/2019, 6:08 AM

## 2019-03-26 NOTE — Progress Notes (Signed)
PT Cancellation Note  Patient Details Name: Megan Hancock MRN: [id number] DOB: 1951-06-30   Cancelled Treatment:    Reason Eval/Treat Not Completed: Patient not medically ready. Pt currently intubated however per RN plan is to extubate later today s/p MRI. PT to return as able to complete PT eval once activity orders progressed to advanced as tolerated.  Kittie Plater, PT, DPT Acute Rehabilitation Services Pager #: [phone] Office #: [phone]    Berline Lopes 03/26/2019, 7:30 AM

## 2019-03-27 ENCOUNTER — Encounter (HOSPITAL_COMMUNITY): Payer: Self-pay | Admitting: Interventional Radiology

## 2019-03-27 ENCOUNTER — Inpatient Hospital Stay (HOSPITAL_COMMUNITY): Payer: Medicare Other

## 2019-03-27 DIAGNOSIS — I63429 Cerebral infarction due to embolism of unspecified anterior cerebral artery: Secondary | ICD-10-CM

## 2019-03-27 DIAGNOSIS — J9600 Acute respiratory failure, unspecified whether with hypoxia or hypercapnia: Secondary | ICD-10-CM

## 2019-03-27 DIAGNOSIS — I639 Cerebral infarction, unspecified: Secondary | ICD-10-CM

## 2019-03-27 DIAGNOSIS — I63512 Cerebral infarction due to unspecified occlusion or stenosis of left middle cerebral artery: Secondary | ICD-10-CM

## 2019-03-27 DIAGNOSIS — I82442 Acute embolism and thrombosis of left tibial vein: Secondary | ICD-10-CM

## 2019-03-27 LAB — URINALYSIS, COMPLETE (UACMP) WITH MICROSCOPIC
Bilirubin Urine: NEGATIVE
Glucose, UA: NEGATIVE mg/dL
Hgb urine dipstick: NEGATIVE
Ketones, ur: 20 mg/dL — AB
Leukocytes,Ua: NEGATIVE
Nitrite: NEGATIVE
Protein, ur: NEGATIVE mg/dL
Specific Gravity, Urine: 1.02 (ref 1.005–1.030)
pH: 5 (ref 5.0–8.0)

## 2019-03-27 LAB — URINE CULTURE: Culture: NO GROWTH

## 2019-03-27 LAB — BASIC METABOLIC PANEL
Anion gap: 12 (ref 5–15)
BUN: 19 mg/dL (ref 8–23)
CO2: 19 mmol/L — ABNORMAL LOW (ref 22–32)
Calcium: 8.6 mg/dL — ABNORMAL LOW (ref 8.9–10.3)
Chloride: 107 mmol/L (ref 98–111)
Creatinine, Ser: 0.92 mg/dL (ref 0.44–1.00)
GFR calc Af Amer: >60 mL/min (ref >60)
GFR calc non Af Amer: >60 mL/min (ref >60)
Glucose, Bld: 98 mg/dL (ref 70–99)
Potassium: 4.1 mmol/L (ref 3.5–5.1)
Sodium: 138 mmol/L (ref 135–145)

## 2019-03-27 LAB — CBC
HCT: 30.5 % — ABNORMAL LOW (ref 36.0–46.0)
Hemoglobin: 9.9 g/dL — ABNORMAL LOW (ref 12.0–15.0)
MCH: 28 pg (ref 26.0–34.0)
MCHC: 32.5 g/dL (ref 30.0–36.0)
MCV: 86.2 fL (ref 80.0–100.0)
Platelets: 224 10*3/uL (ref 150–400)
RBC: 3.54 MIL/uL — ABNORMAL LOW (ref 3.87–5.11)
RDW: 14.8 % (ref 11.5–15.5)
WBC: 12.4 10*3/uL — ABNORMAL HIGH (ref 4.0–10.5)
nRBC: 0 % (ref 0.0–0.2)

## 2019-03-27 MED ORDER — FUROSEMIDE 10 MG/ML IJ SOLN
40.0000 mg | Freq: Once | INTRAMUSCULAR | Status: AC
  Administered 2019-03-27: 15:00:00 40 mg via INTRAVENOUS

## 2019-03-27 MED ORDER — FUROSEMIDE 10 MG/ML IJ SOLN
INTRAMUSCULAR | Status: AC
  Filled 2019-03-27: qty 4

## 2019-03-27 NOTE — Evaluation (Signed)
Occupational Therapy Evaluation Patient Details Name: Megan Hancock MRN: [id number] DOB: 1950/12/22 Today's Date: 03/27/2019    History of Present Illness 68 y.o. female past medical history of COPD, hyperlipidemia, right MCA stroke in February and recent diagnosis of metastatic breast cancer presents to [facility] regional ED with aphasia and R side weak.  Pt was outside TPA window and underwent mechanical thrombectomy with partial revascularization of occluded L MCA. Pt extubated 6/2.    Clinical Impression   This 68 y/o female presents with the above. PTA pt was independent with ADL and functional mobility. Pt presenting with impaired communication, cognitive deficits, R side weakness and suspect R side inattention. Pt requiring totalA+2 for bed mobility; totalA for sitting balance EOB (pt with R lateral lean); and currently totalA for ADL at this time. Pt following approx 50% of simple commands at start of session, increased difficulty following commands with session progression (suspect due to fatigue). Given max cues pt attending to therapist in R visual field but overall unable to track or focus on target when instructed to. She will benefit from continued acute OT services, given pt PLOF currently recommend CIR level therapy services at time of discharge, however will continue to monitor for pt progress with therapies. Will follow.     Follow Up Recommendations  CIR;Supervision/Assistance - 24 hour    Equipment Recommendations  Other (comment)(TBA)    Recommendations for Other Services Rehab consult     Precautions / Restrictions Precautions Precautions: Fall Precaution Comments: R hemi, R inattention, aphasia  Restrictions Weight Bearing Restrictions: No      Mobility Bed Mobility Overal bed mobility: Needs Assistance Bed Mobility: Supine to Sit;Sit to Supine     Supine to sit: Total assist;+2 for physical assistance;+2 for safety/equipment;HOB elevated Sit to supine:  Total assist;+2 for physical assistance;+2 for safety/equipment;HOB elevated   General bed mobility comments: assist for bil LEs and trunk elevation; pt with R lateral lean and requires totalA to maintain sitting balance  Transfers                 General transfer comment: deferred today due to pt safety    Balance Overall balance assessment: Needs assistance Sitting-balance support: Single extremity supported(LUE supported) Sitting balance-Leahy Scale: Zero Sitting balance - Comments: requires totalA for sitting balance, R lateral lean Postural control: Right lateral lean                                 ADL either performed or assessed with clinical judgement   ADL Overall ADL's : Needs assistance/impaired                                     Functional mobility during ADLs: Total assistance;+2 for physical assistance;+2 for safety/equipment General ADL Comments: pt currently requires totalA for ADL and mobility at this time      Vision   Additional Comments: pt intermittently keeping L eye closed during session; max multimodal cues to turn head and look towards R side; unable to visually track target despite max cues to do so     Perception     Praxis      Pertinent Vitals/Pain Pain Assessment: Faces Faces Pain Scale: No hurt Pain Location: withdraws to painful stimuli on hemi side, no other sign/symptoms of pain Pain Intervention(s): Monitored during session  Hand Dominance Right   Extremity/Trunk Assessment Upper Extremity Assessment Upper Extremity Assessment: RUE deficits/detail;LUE deficits/detail;Difficult to assess due to impaired cognition RUE Deficits / Details: grossly 1/5 throughout, withdraws to painful stimuli  RUE Coordination: decreased fine motor;decreased gross motor LUE Deficits / Details: grossly 3-/5, able to assist therapist with raising UE to full flexion  LUE Coordination: decreased fine motor;decreased  gross motor   Lower Extremity Assessment Lower Extremity Assessment: Defer to PT evaluation       Communication Communication Communication: Receptive difficulties;Expressive difficulties   Cognition Arousal/Alertness: Awake/alert Behavior During Therapy: Flat affect Overall Cognitive Status: Difficult to assess Area of Impairment: Following commands;Awareness;Attention                   Current Attention Level: Focused   Following Commands: Follows one step commands inconsistently   Awareness: Intellectual   General Comments: pt initially following approx 50% of simple commands; with further activity pt following decreased amount of one step commands (suspect partly due to fatigue)   General Comments   O2 sats intermittently decrease to 89% on 4L with activity but overall VSS     Exercises     Shoulder Instructions      Home Living Family/patient expects to be discharged to:: Private residence Living Arrangements: Spouse/significant other Available Help at Discharge: Family Type of Home: House Home Access: Stairs to enter Technical brewer of Steps: 3 Entrance Stairs-Rails: Left Home Layout: One level                   Additional Comments: pt unable to provide information, home setup obtained from PT eval in 11/2018       Prior Functioning/Environment Level of Independence: Independent        Comments: per chart review and previous PT eval in 11/2018 pt was independent to mod independent with ADL and functional mobility         OT Problem List: Decreased strength;Decreased range of motion;Decreased activity tolerance;Impaired balance (sitting and/or standing);Impaired vision/perception;Decreased coordination;Decreased cognition;Decreased safety awareness;Impaired UE functional use;Impaired tone;Decreased knowledge of use of DME or AE      OT Treatment/Interventions: Self-care/ADL training;Neuromuscular education;DME and/or AE  instruction;Therapeutic activities;Cognitive remediation/compensation;Visual/perceptual remediation/compensation;Patient/family education;Balance training;Therapeutic exercise    OT Goals(Current goals can be found in the care plan section) Acute Rehab OT Goals Patient Stated Goal: none stated today OT Goal Formulation: Patient unable to participate in goal setting Time For Goal Achievement: 04/10/19 Potential to Achieve Goals: Good  OT Frequency: Min 3X/week   Barriers to D/C:            Co-evaluation PT/OT/SLP Co-Evaluation/Treatment: Yes Reason for Co-Treatment: Complexity of the patient's impairments (multi-system involvement);For patient/therapist safety;To address functional/ADL transfers   OT goals addressed during session: ADL's and self-care;Strengthening/ROM      AM-PAC OT "6 Clicks" Daily Activity     Outcome Measure Help from another person eating meals?: Total Help from another person taking care of personal grooming?: Total Help from another person toileting, which includes using toliet, bedpan, or urinal?: Total Help from another person bathing (including washing, rinsing, drying)?: Total Help from another person to put on and taking off regular upper body clothing?: Total Help from another person to put on and taking off regular lower body clothing?: Total 6 Click Score: 6   End of Session Equipment Utilized During Treatment: Oxygen Nurse Communication: Mobility status  Activity Tolerance: Patient tolerated treatment well Patient left: in bed;with call bell/phone within reach;with bed alarm set  OT Visit  Diagnosis: Cognitive communication deficit (R41.841);Hemiplegia and hemiparesis Symptoms and signs involving cognitive functions: Cerebral infarction Hemiplegia - Right/Left: Right Hemiplegia - caused by: Cerebral infarction                Time: 9507-2257 OT Time Calculation (min): 28 min Charges:  OT General Charges $OT Visit: 1 Visit OT Evaluation $OT  Eval Moderate Complexity: 1 Mod  Lou Cal, OT [employer] Pager [phone] Office [phone]    Raymondo Band 03/27/2019, 10:47 AM

## 2019-03-27 NOTE — Evaluation (Addendum)
Physical Therapy Evaluation Patient Details Name: Megan Hancock MRN: [id number] DOB: 1951/05/15 Today's Date: 03/27/2019   History of Present Illness  68 y.o. female past medical history of COPD, hyperlipidemia, right MCA stroke in February and recent diagnosis of metastatic breast cancer presents to [facility] regional ED with aphasia and R side weak.  Pt was outside TPA window and underwent mechanical thrombectomy with partial revascularization of occluded L MCA. Pt extubated 6/2.   Clinical Impression  Pt admitted with above diagnosis. Pt currently with functional limitations due to the deficits listed below (see PT Problem List). Prior to admission, pt independent with mobility and ADL's. Pt presents with decreased functional mobility secondary to right sided hemiplegia, right neglect/inattention, global aphasia, and poor sitting balance. Pt withdrawing right arm and leg to pain, noted extensor synergy pattern. Pt following simple commands inconsistently. Requiring two person total assistance for bed mobility. Bed left in chair position at end of session to promote upright. Given pt PLOF and age, suspect pt may progress and recommending CIR to maximize functional independence.        Follow Up Recommendations CIR    Equipment Recommendations  Other (comment)(tbd)    Recommendations for Other Services Rehab consult     Precautions / Restrictions Precautions Precautions: Fall Precaution Comments: R hemiplegia, R neglect, aphasia  Restrictions Weight Bearing Restrictions: No      Mobility  Bed Mobility Overal bed mobility: Needs Assistance Bed Mobility: Supine to Sit;Sit to Supine     Supine to sit: Total assist;+2 for physical assistance;+2 for safety/equipment;HOB elevated Sit to supine: Total assist;+2 for physical assistance;+2 for safety/equipment;HOB elevated   General bed mobility comments: assist for bil LEs and trunk elevation; pt with R lateral lean and requires  totalA to maintain sitting balance  Transfers                 General transfer comment: deferred today due to pt safety  Ambulation/Gait                Stairs            Wheelchair Mobility    Modified Rankin (Stroke Patients Only) Modified Rankin (Stroke Patients Only) Pre-Morbid Rankin Score: No significant disability Modified Rankin: Severe disability     Balance Overall balance assessment: Needs assistance Sitting-balance support: Single extremity supported(LUE supported) Sitting balance-Leahy Scale: Zero Sitting balance - Comments: requires totalA for sitting balance, R lateral lean Postural control: Right lateral lean                                   Pertinent Vitals/Pain Pain Assessment: Faces Faces Pain Scale: No hurt Pain Location: withdraws to painful stimuli on hemi side, no other sign/symptoms of pain Pain Intervention(s): Monitored during session    Home Living Family/patient expects to be discharged to:: Private residence Living Arrangements: Spouse/significant other Available Help at Discharge: Family Type of Home: House Home Access: Stairs to enter Entrance Stairs-Rails: Left Entrance Stairs-Number of Steps: 3 Home Layout: One level   Additional Comments: pt unable to provide information, home setup obtained from PT eval in 11/2018     Prior Function Level of Independence: Independent         Comments: per chart review and previous PT eval in 11/2018 pt was independent to mod independent with ADL and functional mobility      Hand Dominance   Dominant Hand: Right  Extremity/Trunk Assessment   Upper Extremity Assessment Upper Extremity Assessment: RUE deficits/detail RUE Deficits / Details: grossly 1/5 throughout, withdraws to painful stimuli  RUE Coordination: decreased fine motor;decreased gross motor LUE Deficits / Details: grossly 3-/5, able to assist therapist with raising UE to full flexion  LUE  Coordination: decreased fine motor;decreased gross motor    Lower Extremity Assessment Lower Extremity Assessment: RLE deficits/detail;LLE deficits/detail RLE Deficits / Details: Noted extensor synergy pattern, moving right ankle into inversion. No other active movement noted LLE Deficits / Details: Moves spontaneously       Communication   Communication: Receptive difficulties;Expressive difficulties(nonverbal)  Cognition Arousal/Alertness: Awake/alert Behavior During Therapy: Flat affect Overall Cognitive Status: Difficult to assess Area of Impairment: Following commands;Awareness;Attention                   Current Attention Level: Focused   Following Commands: Follows one step commands inconsistently   Awareness: Intellectual   General Comments: pt initially following approx 50% of simple commands; with further activity pt following decreased amount of one step commands (suspect partly due to fatigue)      General Comments General comments (skin integrity, edema, etc.):  O2 sats intermittently decrease to 89% on 4L with activity but overall VSS     Exercises     Assessment/Plan    PT Assessment Patient needs continued PT services  PT Problem List Decreased strength;Decreased activity tolerance;Decreased balance;Decreased mobility;Decreased cognition;Impaired tone       PT Treatment Interventions Gait training;Functional mobility training;Therapeutic activities;Therapeutic exercise;Balance training;Patient/family education;Wheelchair mobility training;Cognitive remediation    PT Goals (Current goals can be found in the Care Plan section)  Acute Rehab PT Goals Patient Stated Goal: none stated today PT Goal Formulation: With patient Time For Goal Achievement: 04/10/19 Potential to Achieve Goals: Fair    Frequency Min 4X/week   Barriers to discharge        Co-evaluation PT/OT/SLP Co-Evaluation/Treatment: Yes Reason for Co-Treatment: Complexity of the  patient's impairments (multi-system involvement);For patient/therapist safety;To address functional/ADL transfers;Necessary to address cognition/behavior during functional activity PT goals addressed during session: Mobility/safety with mobility;Balance OT goals addressed during session: ADL's and self-care;Strengthening/ROM       AM-PAC PT "6 Clicks" Mobility  Outcome Measure Help needed turning from your back to your side while in a flat bed without using bedrails?: Total Help needed moving from lying on your back to sitting on the side of a flat bed without using bedrails?: Total Help needed moving to and from a bed to a chair (including a wheelchair)?: Total Help needed standing up from a chair using your arms (e.g., wheelchair or bedside chair)?: Total Help needed to walk in hospital room?: Total Help needed climbing 3-5 steps with a railing? : Total 6 Click Score: 6    End of Session Equipment Utilized During Treatment: Oxygen Activity Tolerance: Patient limited by fatigue Patient left: in bed;with call bell/phone within reach;with bed alarm set;with SCD's reapplied Nurse Communication: Mobility status PT Visit Diagnosis: Other abnormalities of gait and mobility (R26.89);Other symptoms and signs involving the nervous system (R29.898);Hemiplegia and hemiparesis Hemiplegia - Right/Left: Right Hemiplegia - dominant/non-dominant: Dominant Hemiplegia - caused by: Cerebral infarction    Time: 2694-8546 PT Time Calculation (min) (ACUTE ONLY): 27 min   Charges:   PT Evaluation $PT Eval Moderate Complexity: 1 Mod          Ellamae Sia, [place], DPT Acute Rehabilitation Services Pager [phone] Office [phone]   Willy Eddy 03/27/2019, 10:57 AM

## 2019-03-27 NOTE — Progress Notes (Signed)
Assisted tele visit to patient with husband, [name].  Maryelizabeth Rowan, RN

## 2019-03-27 NOTE — Progress Notes (Signed)
Pt slightly more SOB, sats low 90s. MD notifed, orders received.

## 2019-03-27 NOTE — Evaluation (Addendum)
Speech Language Pathology Evaluation Patient Details Name: Megan Hancock MRN: [id number] DOB: Jan 16, 1951 Today's Date: 03/27/2019 Time: 5366-4403 SLP Time Calculation (min) (ACUTE ONLY): 10 min  Problem List:  Patient Active Problem List   Diagnosis Date Noted  . Cerebrovascular accident (CVA) ([place])   . Acute ischemic left MCA stroke ([place])   . 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 Acute respiratory failure ([place])   . Acute ischemic stroke ([place]) 12/19/2018   Past Medical History:  Past Medical History:  Diagnosis Date  . COPD (chronic obstructive pulmonary disease) ([place])   . Hypercholesteremia   . IBS (irritable bowel syndrome)   . Primary breast cancer with metastasis to other site [facility])    Past Surgical History:  Past Surgical History:  Procedure Laterality Date  . IR CT HEAD LTD  04/18/2019  . IR PERCUTANEOUS ART THROMBECTOMY/INFUSION INTRACRANIAL INC DIAG ANGIO  03/26/2019  . LOOP RECORDER INSERTION N/A 01/01/2019   Procedure: LOOP RECORDER INSERTION;  Surgeon: Isaias Cowman, MD;  Location: [place] [facility];  Service: Cardiovascular;  Laterality: N/A;  . RADIOLOGY WITH ANESTHESIA N/A 04/10/2019   Procedure: IR WITH ANESTHESIA/CODE STROKE;  Surgeon: Luanne Bras, MD;  Location: [place];  Service: Radiology;  Laterality: N/A;  . TEE WITHOUT CARDIOVERSION N/A 12/21/2018   Procedure: TRANSESOPHAGEAL ECHOCARDIOGRAM (TEE);  Surgeon: Teodoro Spray, MD;  Location: [facility];  Service: Cardiovascular;  Laterality: N/A;   HPI: Megan Hancock is a 68 y.o. 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 Intubated 6/1-6/2 for cerebral angio L MCA M2 occlusion with TICI 2a/2b revascularization  MRI Large area of acute infarction roughly corresponding to the posterior/inferior division LEFT MCA,  proximal M2 occlusion, Intracranial hemorrhage following reperfusion therapy consisting of both parenchymal hematoma, as well as subarachnoid hemorrhage.  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 Poor attention to speaker when on pt's left side and inattention to speaker/objects on right field of vision. Pt with grunts of displeasure during oral care. Unable to elicit volitional phonation. Pt did not respond to simple command verbally or with visual stimuli or biographical yes/no questions. She appeared distracted/frustrated /decreased overall awareness with therapist/pain. SLP will see for language therapy and follow outcome of Palliative care meeting     SLP Assessment  SLP Recommendation/Assessment: Patient needs continued Speech Lanaguage Pathology Services SLP Visit Diagnosis: Aphasia (R47.01)    Follow Up Recommendations  Other (comment)(TBD)    Frequency and Duration min 2x/week  2 weeks      SLP Evaluation Cognition  Overall Cognitive Status: Impaired/Different from baseline Arousal/Alertness: Lethargic Orientation Level: (no response to y/n questions) Attention: Sustained Sustained Attention: Impaired Sustained Attention Impairment: Verbal basic;Functional basic Memory: (unable to fully assess) Awareness: (will assess further) Problem Solving: (will assess further) Safety/Judgment: Impaired       Comprehension  Auditory Comprehension Overall Auditory Comprehension: Impaired Yes/No Questions: Impaired Basic Biographical Questions: Other (comment)(no response) Commands: Impaired One Step Basic Commands: 0-24% accurate(0%) Interfering Components: Attention Visual Recognition/Discrimination Discrimination: Not tested Reading Comprehension Reading Status: Not tested    Expression Expression Primary Mode of  Expression: Other (comment)(no intentional communicative expression) Verbal Expression Overall Verbal Expression: Impaired Initiation: Impaired Automatic Speech: (no response) Level of Generative/Spontaneous Verbalization: (phoneme) Repetition: (unable)  Naming: Not tested Pragmatics: Impairment Impairments: Abnormal affect;Eye contact Interfering Components: Attention Written Expression Dominant Hand: Right Written Expression: Not tested   Oral / Motor  Oral Motor/Sensory Function Overall Oral Motor/Sensory Function: Mild impairment Facial ROM: Reduced right;Suspected CN VII (facial) dysfunction Facial Symmetry: Abnormal symmetry right;Suspected CN VII (facial) dysfunction Motor Speech Overall Motor Speech: Other (comment)(will assess when verbalizing)   GO                    Houston Siren 03/27/2019, 2:28 PM   Orbie Pyo Colvin Caroli.Ed Risk analyst [phone] Office [phone]

## 2019-03-27 NOTE — Progress Notes (Signed)
Pt tachycardic and tachypneic, MD notified.

## 2019-03-27 NOTE — Evaluation (Signed)
Clinical/Bedside Swallow Evaluation Patient Details  Name: Megan Hancock MRN: [id number] Date of Birth: [birth year]  Today's Date: 03/27/2019 Time: SLP Start Time (ACUTE ONLY): 6503 SLP Stop Time (ACUTE ONLY): 5465 SLP Time Calculation (min) (ACUTE ONLY): 6 min  Past Medical History:  Past Medical History:  Diagnosis Date  . COPD (chronic obstructive pulmonary disease) ([place])   . Hypercholesteremia   . IBS (irritable bowel syndrome)   . Primary breast cancer with metastasis to other site [facility])    Past Surgical History:  Past Surgical History:  Procedure Laterality Date  . IR CT HEAD LTD  04/21/2019  . IR PERCUTANEOUS ART THROMBECTOMY/INFUSION INTRACRANIAL INC DIAG ANGIO  04/11/2019  . LOOP RECORDER INSERTION N/A 01/01/2019   Procedure: LOOP RECORDER INSERTION;  Surgeon: Isaias Cowman, MD;  Location: [place] [facility];  Service: Cardiovascular;  Laterality: N/A;  . RADIOLOGY WITH ANESTHESIA N/A 04/10/2019   Procedure: IR WITH ANESTHESIA/CODE STROKE;  Surgeon: Luanne Bras, MD;  Location: [place];  Service: Radiology;  Laterality: N/A;  . TEE WITHOUT CARDIOVERSION N/A 12/21/2018   Procedure: TRANSESOPHAGEAL ECHOCARDIOGRAM (TEE);  Surgeon: Teodoro Spray, MD;  Location: [facility];  Service: Cardiovascular;  Laterality: N/A;   HPI:  Megan Hancock is a 67 y.o. 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 Intubated 6/1-6/2 for cerebral angio L MCA M2 occlusion with TICI 2a/2b revascularization  MRI Large area of acute infarction roughly corresponding to the posterior/inferior division LEFT MCA, proximal M2 occlusion, Intracranial hemorrhage following reperfusion therapy consisting of both parenchymal hematoma, as well as subarachnoid hemorrhage.   Assessment / Plan / Recommendation Clinical Impression  Significant  audible congestion noted at rest and inability to produce volitional cough. Unfortunately her global aphasia/possible apraxia prevented her from following command. Labial spill and immediate cough with first cup sip water indicative of probable penetration/aspiration. She is awake althogh drowsy and no further po trials given. Spoke with neurologist who reported Palliative meeting with family is pending (recent breast cancer dx) and will discuss desire for alternate means of nutrition. ST will follow. Continue routine oral care.   SLP Visit Diagnosis: Dysphagia, unspecified (R13.10)    Aspiration Risk  Moderate aspiration risk;Severe aspiration risk    Diet Recommendation NPO   Medication Administration: Via alternative means    Other  Recommendations Oral Care Recommendations: Oral care QID   Follow up Recommendations Other (comment)(TBD)      Frequency and Duration min 1 x/week  1 week       Prognosis Prognosis for Safe Diet Advancement: Fair Barriers to Reach Goals: Other (Comment)(co-morbidities)      Swallow Study   General HPI: Megan Hancock is a 68 y.o. 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 Intubated 6/1-6/2 for cerebral angio L MCA M2 occlusion with TICI 2a/2b revascularization  MRI Large area of acute infarction roughly corresponding to the posterior/inferior division LEFT MCA, proximal M2 occlusion, Intracranial hemorrhage following reperfusion therapy consisting of both parenchymal hematoma, as well as subarachnoid hemorrhage. Type of Study: Bedside Swallow Evaluation Previous Swallow Assessment: (none) Diet Prior to this Study: NPO Temperature Spikes Noted: Yes Respiratory Status: Nasal cannula History of Recent Intubation: Yes Length of Intubations (days): 1 days Date extubated:  03/26/19 Behavior/Cognition: Distractible;Lethargic/Drowsy;Requires cueing;Doesn't follow directions Oral Cavity Assessment: Other (comment)(unable to completely view) Oral Care Completed by SLP: Yes Oral Cavity - Dentition: Missing dentition;Other (Comment)(missing posterior lower) Vision: (?) Self-Feeding Abilities: Total assist Patient Positioning: Upright in bed Baseline Vocal Quality: Wet Volitional Cough: Other (Comment)(unable) Volitional Swallow: Unable to elicit    Oral/Motor/Sensory Function Overall Oral Motor/Sensory Function: Mild impairment Facial ROM: Reduced right;Suspected CN VII (facial) dysfunction Facial Symmetry: Abnormal symmetry right;Suspected CN VII (facial) dysfunction   Ice Chips Ice chips: Not tested   Thin Liquid Thin Liquid: Impaired Presentation: Cup Oral Phase Impairments: Reduced labial seal Oral Phase Functional Implications: Right anterior spillage Pharyngeal  Phase Impairments: Cough - Immediate    Nectar Thick Nectar Thick Liquid: Not tested   Honey Thick Honey Thick Liquid: Not tested   Puree Puree: Not tested   Solid     Solid: Not tested      Houston Siren 03/27/2019,2:11 PM  Orbie Pyo Colvin Caroli.Ed Risk analyst [phone] Office [phone]

## 2019-03-27 NOTE — Progress Notes (Signed)
She is tolerating extubation well without evidence respiratory distress.  She is on [place] O2. RASS 0.  Nonverbal.  Not following commands.  Vitals:   03/27/19 0600 03/27/19 0700 03/27/19 0800 03/27/19 0830  BP: (!) 140/99 110/66 124/69 117/77  Pulse: (!) 120 (!) 111 95 93  Resp: 19 (!) 21 17 17   Temp: 99.9 F (37.7 C)  99.9 F (37.7 C)   TempSrc: Axillary  Axillary   SpO2: 95% 94% 98% 98%  Height:       NAD NCAT, sclerae white Neck supple without adenopathy or JVD Chest: Few scattered rhonchi Regular, no M Abdomen soft, + BS Extremities warm, no edema Neuro: A phasic, not F/C, R hemineglect and hemiplegia, reduced tone RUE, reduced withdrawal from painful stimuli in RUE and RLE, L patellar DTR 2+, R patellar DTR 3+  BMP Latest Ref Rng & Units 03/27/2019 03/26/2019 04/17/2019  Glucose 70 - 99 mg/dL 98 120(H) -  BUN 8 - 23 mg/dL 19 20 -  Creatinine 0.44 - 1.00 mg/dL 0.92 1.08(H) -  Sodium 135 - 145 mmol/L 138 138 137  Potassium 3.5 - 5.1 mmol/L 4.1 4.6 4.5  Chloride 98 - 111 mmol/L 107 109 -  CO2 22 - 32 mmol/L 19(L) 20(L) -  Calcium 8.9 - 10.3 mg/dL 8.6(L) 8.4(L) -   CBC Latest Ref Rng & Units 03/27/2019 03/26/2019 04/12/2019  WBC 4.0 - 10.5 K/uL 12.4(H) 12.1(H) -  Hemoglobin 12.0 - 15.0 g/dL 9.9(L) 9.8(L) 9.5(L)  Hematocrit 36.0 - 46.0 % 30.5(L) 31.4(L) 28.0(L)  Platelets 150 - 400 K/uL 224 251 -    CXR 06/02: Suggestion of L suprahilar mass, no acute findings  IMPRESSION: 1) VDRF - Resolved. 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 Erlinda Hong Palliative care consultation requested Post CVA care per Neurology Service PCCM service will sign off  Merton Border, MD PCCM service Mobile [phone] Pager  [phone] 03/27/2019 10:59 AM

## 2019-03-27 NOTE — Progress Notes (Signed)
IR rounding note via telephone per new regulations. Spoke with Elane, RN.  Patient with history of acute CVA s/p emergent cerebral arteriogram with mechanical thrombectomy of left MCA inferior division occlusion achieving a TICI 2b revascularization 04/08/2019 by Dr. Estanislado Pandy.  MR brain 03/26/2019: 1. Large area of acute infarction roughly corresponding to the posterior/inferior division LEFT MCA, proximal M2 occlusion. 2. Intracranial hemorrhage following reperfusion therapy consisting of both parenchymal hematoma occupying roughly 30% of the infarcted tissue with mass effect, as well as subarachnoid hemorrhage. Heidelburg classification PH2 and 3C. 3. 3 mm LEFT-to-RIGHT mass effect at septum pellucidum. Continued surveillance is warranted.  Alert and awake. She does not follow commands. Demonstrates global aphasia. PERRL bilaterally. Demonstrates left gaze preference. Demonstrates right facial droop. Tongue midline. Can spontaneously move left side, right side withdraws from pain. Distal pulses 1+ bilaterally. Right groin incision soft without active bleeding or hematoma.  Patient's condition stable- has been extubated, moving left side but not right, global aphasia. Right groin stable. Appreciate and agree with neurology management. Please call IR with questions/concerns.   Bea Graff Sheralee Qazi, PA-C 03/27/2019, 10:33 AM

## 2019-03-27 NOTE — Progress Notes (Signed)
Bilateral lower extremity venous duplex has been completed. Preliminary results can be found in CV Proc through chart review.  Results were given to the patient's nurse, Margaretha Sheffield.  03/27/19 2:47 PM Megan Hancock RVT

## 2019-03-27 NOTE — Progress Notes (Signed)
STROKE TEAM PROGRESS NOTE   INTERVAL HISTORY Pt lying in bed, eyes open, awake but still global aphasia, not following commands. Copious secretions, not passing swallow. Decreased IVF and received lasix. Discussed with Dr. Alva Garnet and will have palliative care consult. I had long discussion with husband over the phone, updated pt current condition, treatment plan and potentially poor prognosis. He expressed understanding and appreciation.    Vitals:   03/27/19 1300 03/27/19 1400 03/27/19 1500 03/27/19 1600  BP: (!) 144/87 (!) 147/61 (!) 153/76 133/79  Pulse: (!) 111 (!) 107 (!) 120 (!) 123  Resp: (!) 21 (!) 25 (!) 36 (!) 27  Temp:    98.6 F (37 C)  TempSrc:    Axillary  SpO2: 91% 93% 94% 93%  Height:        CBC:  Recent Labs  Lab 04/06/2019 0726  03/26/19 0146 03/27/19 0224  WBC 12.9*  --  12.1* 12.4*  NEUTROABS 9.5*  --  10.3*  --   HGB 11.2*   < > 9.8* 9.9*  HCT 35.3*   < > 31.4* 30.5*  MCV 86.7  --  87.2 86.2  PLT 256  --  251 224   < > = values in this interval not displayed.    Basic Metabolic Panel:  Recent Labs  Lab 03/26/19 0146 03/27/19 0224  NA 138 138  K 4.6 4.1  CL 109 107  CO2 20* 19*  GLUCOSE 120* 98  BUN 20 19  CREATININE 1.08* 0.92  CALCIUM 8.4* 8.6*   Lipid Panel:     Component Value Date/Time   CHOL 97 03/26/2019 0146   TRIG 98 03/26/2019 0146   HDL 39 (L) 03/26/2019 0146   CHOLHDL 2.5 03/26/2019 0146   VLDL 20 03/26/2019 0146   LDLCALC 38 03/26/2019 0146   HgbA1c:  Lab Results  Component Value Date   HGBA1C 5.7 (H) 03/26/2019   Urine Drug Screen: No results found for: LABOPIA, COCAINSCRNUR, LABBENZ, AMPHETMU, THCU, LABBARB  Alcohol Level No results found for: [facility]  IMAGING Mr Brain Wo Contrast  Result Date: 03/26/2019 CLINICAL DATA:  Status post CVA with thrombectomy. RIGHT-sided weakness. EXAM: MRI HEAD WITHOUT CONTRAST TECHNIQUE: Multiplanar, multiecho pulse sequences of the brain and surrounding structures were obtained without  intravenous contrast. COMPARISON:  CTA and CTP 03/27/2019. Catheter angiogram 04/05/2019 with postprocedure CT. MR brain and MRA 12/20/2018 FINDINGS: Brain: Large area of restricted diffusion, corresponding low ADC, largely corresponding to the posterior inferior division of the LEFT MCA. The area of infarction roughly corresponds to the penumbra observed on prior CTP, slightly greater. There is involvement of the insula, temporal lobe, frontal lobe, parietal lobe, and occipital lobe, as well as periventricular white matter Ovoid parenchymal hemorrhage, epicenter LEFT insula and external capsule, approximate measurements 27 x 44 x 25 mm, corresponding volume 15 mL. Subarachnoid hemorrhage within the LEFT sylvian fissure and over the LEFT cerebral convexity is also noted, and corresponds with the postprocedure CT. Mild mass effect LEFT-to-RIGHT at the septum pellucidum level, 3 mm. Chronic RIGHT parietal infarction with encephalomalacia, was acute in February 2020. Vascular: Proximal flow voids are maintained, including LEFT M1 MCA. Skull and upper cervical spine: Unremarkable calvarium and skull base. Sebaceous cyst RIGHT frontal scalp. Sinuses/Orbits: Negative sinuses.  BILATERAL cataract extraction. Other: None. IMPRESSION: Large area of acute infarction roughly corresponding to the posterior/inferior division LEFT MCA, proximal M2 occlusion. Intracranial hemorrhage following reperfusion therapy consisting of both parenchymal hematoma occupying roughly 30% of the infarcted tissue with mass  effect, as well as subarachnoid hemorrhage. Heidelburg classification PH2 and 3C. 3 mm LEFT-to-RIGHT mass effect at septum pellucidum. Continued surveillance is warranted. Electronically Signed   By: Staci Righter M.D.   On: 03/26/2019 13:31   Dg Chest Port 1 View  Result Date: 03/27/2019 CLINICAL DATA:  CVA.  Endotracheal tube removal. EXAM: PORTABLE CHEST 1 VIEW COMPARISON:  03/26/2019 FINDINGS: The endotracheal tube has been  removed. Associated mild hypoaeration with low lung volumes, vascular crowding and streaky bibasilar atelectasis. No edema or definite effusions. IMPRESSION: Hypoaeration status post extubation with low lung volumes and streaky bibasilar atelectasis. Electronically Signed   By: Marijo Sanes M.D.   On: 03/27/2019 12:16   Portable Chest Xray  Result Date: 03/26/2019 CLINICAL DATA:  Check endotracheal tube placement EXAM: PORTABLE CHEST 1 VIEW COMPARISON:  04/15/2019 FINDINGS: Cardiac shadow is stable. Loop recorder is seen. Endotracheal tube is noted in satisfactory position. Fullness in the left hilum is again identified but slightly less prominent. Nonemergent CT of the chest is recommended for further evaluation when the patient's condition improves. The lungs are well aerated without focal infiltrate. No bony abnormality is noted. IMPRESSION: Persistent fullness in the left hilum. CT is again recommended for further evaluation when clinically able. Electronically Signed   By: Inez Catalina M.D.   On: 03/26/2019 07:50   Vas [place] Lower Extremity Venous (dvt)  Result Date: 03/27/2019  Lower Venous Study Indications: Stroke.  Limitations: Patient positioning, patient immobility. Performing Technologist: Oliver Hum RVT  Examination Guidelines: A complete evaluation includes B-mode imaging, spectral Doppler, color Doppler, and power Doppler as needed of all accessible portions of each vessel. Bilateral testing is considered an integral part of a complete examination. Limited examinations for reoccurring indications may be performed as noted.  +---------+---------------+---------+-----------+----------+-------+ RIGHT    CompressibilityPhasicitySpontaneityPropertiesSummary +---------+---------------+---------+-----------+----------+-------+ CFV      Full           Yes      Yes                          +---------+---------------+---------+-----------+----------+-------+ SFJ      Full                                                  +---------+---------------+---------+-----------+----------+-------+ FV Prox  Full                                                 +---------+---------------+---------+-----------+----------+-------+ FV Mid   Full                                                 +---------+---------------+---------+-----------+----------+-------+ FV DistalFull                                                 +---------+---------------+---------+-----------+----------+-------+ PFV      Full                                                 +---------+---------------+---------+-----------+----------+-------+  POP      Full           Yes      Yes                          +---------+---------------+---------+-----------+----------+-------+ PTV      Full                                                 +---------+---------------+---------+-----------+----------+-------+ PERO     Full                                                 +---------+---------------+---------+-----------+----------+-------+   +---------+---------------+---------+-----------+----------+-------+ LEFT     CompressibilityPhasicitySpontaneityPropertiesSummary +---------+---------------+---------+-----------+----------+-------+ CFV      Full           Yes      Yes                          +---------+---------------+---------+-----------+----------+-------+ SFJ      None                                         Acute   +---------+---------------+---------+-----------+----------+-------+ FV Prox  Full                                                 +---------+---------------+---------+-----------+----------+-------+ FV Mid   Full                                                 +---------+---------------+---------+-----------+----------+-------+ FV DistalFull                                                  +---------+---------------+---------+-----------+----------+-------+ PFV      Full                                                 +---------+---------------+---------+-----------+----------+-------+ POP      Full           Yes      Yes                          +---------+---------------+---------+-----------+----------+-------+ PTV      Partial                                      Acute   +---------+---------------+---------+-----------+----------+-------+ PERO     Full                                                 +---------+---------------+---------+-----------+----------+-------+  GSV      None                                         Acute   +---------+---------------+---------+-----------+----------+-------+ SVT at [facility] in the GSV.    Summary: Right: There is no evidence of deep vein thrombosis in the lower extremity. No cystic structure found in the popliteal fossa. Left: Findings consistent with acute deep vein thrombosis involving the left posterior tibial vein. Findings consistent with acute superficial vein thrombosis involving the left great saphenous vein. No cystic structure found in the popliteal fossa.  *See table(s) above for measurements and observations.    Preliminary    Cerebral angiogram  partial revascularization of occluded Lt MCA  Inf division with x 2 passes with 40mm x 87mm embotrap retriever device  And penumbra aspiration achieving a TICI 2a/2b revascularization  2D Echocardiogram  1. The left ventricle has normal systolic function with an ejection fraction of 60-65%. The cavity size was normal. Left ventricular diastolic Doppler parameters are consistent with pseudonormalization. No evidence of left ventricular regional wall  motion abnormalities.  2. The right ventricle has normal systolic function. The cavity was normal. There is no increase in right ventricular wall thickness.  3. The pericardial effusion is posterior to the left ventricle.   4. Trivial pericardial effusion is present.  5. There is mild to moderate mitral annular calcification present.  6. The aortic valve is tricuspid. Moderate stenosis of the aortic valve.  7. The aortic root and ascending aorta are normal in size and structure.  8. The interatrial septum was not well visualized.   PHYSICAL EXAM  Temp:  [98.6 F (37 C)-99.9 F (37.7 C)] 98.6 F (37 C) (06/03 1600) Pulse Rate:  [93-130] 123 (06/03 1600) Resp:  [14-36] 27 (06/03 1600) BP: (110-153)/(54-123) 133/79 (06/03 1600) SpO2:  [90 %-98 %] 93 % (06/03 1600)  General - Well nourished, well developed, extubated but mild respiratory distress with secretions.  Ophthalmologic - fundi not visualized due to noncooperation.  Cardiovascular - Regular rate and rhythm.  Neuro - extubated but mild respiratory distress, eyes open, not following commands except possible close eyes on command, nonverbal. Eyes in left gaze position, inconsistently blinking to visual threat on the left, not blinking to visual threat on the right, not tracking on the right, PERRL. Right facial droop, tongue midline in mouth. Spontaneous raising up LUE at least 3+/5 but with pain stimulation BLE 2-/5 with mild withdraw, no movement of RUE. DTR diminished and no babinski. Sensation, coordination and gait not tested.   ASSESSMENT/PLAN [name] is a 68 y.o. 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  Out of the TPA window.  Found to have a left M2 LVO transferred to [facility] and taken to IR.   Stroke:   L MCA infarct s/p parital IR L M2 with post revascularization hemorrhage and cerebral edema - embolic could be secondary to hypercoagulable state from metastatic breast cancer   Code Stroke CT head Acute L MCA infarct. Possible hyperdense L MCA branch. Expected evolution poster R MCA infarct since Feb.  CTA head  & neck partially visible malignancy upper chest and neck w/ lymphadenopathy and 3-4 cm  L mediastinal mass. L M2 ELVO  CT perfusion 2mL core infarct w/  57mL penumbra and hypoperfusion index 0.5  Cerebral angio L MCA M2 occlusion with TICI 2a/2b revascularization   MRI large left MCA infarct due to proximal M2 occlusion.  Intracranial hemorrhage following reperfusion consisting of both IPH of 30% of infarcted tissue as well as SAH, pH 2 and 3C.   2D Echo EF 60-65%. No source of embolus   11/2018 TEE showed no PFO  Loop recorder placed 12/31/2008 interrogation requested - pending report  LDL 38  HgbA1c 5.7  SCDs for VTE prophylaxis  aspirin 81 mg daily prior to admission, now on No antithrombotic given post revascularization hemorrhage  Therapy recommendations: Pending  Disposition: Pending  Palliative care consult and family meeting in am  Acute respiratory failure  COPD  Intubated for IR in ED   Extubated 6/2 w/ HR 100-110s afterwards  CTA neck showed partially visible malignancy upper chest and neck w/ lymphadenopathy and 3-4 cm  L mediastinal mass.  CCM on board  Received lasix once   Left LE DVT  LE venous doppler - acute DVT left posterior tibial vein  Likely related to advanced cancer  No AC due to bleeding and distal DVT  Pending palliative care discussion to decide whether IVC filter needed in the future if needed  Primary breast cancer with metastasis  new diagnosis, for evaluation by oncology on 03/27/2019 in [place]  CTA neck showed partially visible malignancy upper chest and neck w/ lymphadenopathy and 3-4 cm  L mediastinal mass.  Baseline dyspnea likely caused by metastatic disease  Hx stroke/TIA  11/6201 -embolic right MCA infarct with resultant short-term memory deficits and imbalance, TEE neg, loop recorder placed, put on aspirin and statin  Fever  T-max 100.6->afebrile  Leukocytosis, likely reactive 12.1->12.4  UA  negative  Urine culture pending  Blood culture no growth < 12h  CXR Persistent fullness in the left hilum - received lasix  Blood pressure   Home meds: None  BP stable  . BP goal < 160 given hemorrhagic conversion  Hyperlipidemia  Home meds: Zocor 40  LDL 38, goal < 70  Resume half-strength Zocor once able to swallow   Continue statin at discharge  Dysphagia  Secondary to stroke  Failed swallow  Currently NPO  Speech to follow  On gentle IVF  Other Stroke Risk Factors  Advanced age  History ETOH use  Family hx stroke (daughter)  Other [facility] day # 2  This patient is critically ill due to large left MCA stroke, hemorrhagic conversion, respiratory failure, history of stroke, metastatic breast cancer, and fever and at significant risk of neurological worsening, death form recurrent stroke, hematoma expansion, cerebral edema, brain herniation, seizure, sepsis. This patient's care requires constant monitoring of vital signs, hemodynamics, respiratory and cardiac monitoring, review of multiple databases, neurological assessment, discussion with family, other specialists and medical decision making of high complexity. I spent 40 minutes of neurocritical care time in the care of this patient. I again had long discussion with husband over the phone, updated pt current condition, treatment plan and poor prognosis.  He expressed understanding and appreciation and agreed with palliative care consult.    Rosalin Hawking, MD PhD Stroke Neurology 03/27/2019 4:57 PM   To contact Stroke Continuity provider, please refer to [email or web address] After hours, contact General Neurology

## 2019-03-27 NOTE — Progress Notes (Signed)
Pharmacy is unable to confirm the medications the patient was taking at home. All options have been exhausted and a resolution to the situation is not expected.   Where possible, their outpatient pharmacy(s) have been contacted for the last time prescriptions were filled and that information has been added to each medication in an Order Note (highlighted yellow below the medication).  Please contact pharmacy if further assistance is needed.   Romeo Rabon, PharmD. Mobile: [phone]. 03/27/2019,8:38 AM.

## 2019-03-27 NOTE — Progress Notes (Signed)
Pt appears a bit more confortable: HR 115, RR21, sat 94%. Will continue to monitor.

## 2019-03-27 NOTE — Progress Notes (Signed)
Patient ID: Megan Hancock, female   DOB: 02/01/1951, 68 y.o.   MRN: [id number]   Thank you for consulting the Palliative Medicine Team at [facility] to meet your patient's and family's needs.   The reason that you asked [place] to see your patient is to establish GOCs and emotional support  We have scheduled your patient for a meeting: Tomorrow morning at 0900 with husband  Cleared with unit for him to be present at bedside for this meeting.  Your patient is able/unable to participate: unable   No charge   Wadie Lessen NP  Palliative Medicine Team Team Phone # [phone] Pager [phone]

## 2019-03-27 NOTE — Progress Notes (Signed)
Rehab Admissions Coordinator Note:  Per PT recommendation, this patient was screened by [name] for appropriateness for an Inpatient Acute Rehab Consult.  Noted pt currently total assist with bed mobility and transfers deferred due to safety. At this time, we will follow for progress and tolerance with therapies. If pt shows greater tolerance and ability to participate, we will pursue order for consult.   [name] 03/27/2019, 12:13 PM  I can be reached at [phone].

## 2019-03-28 ENCOUNTER — Inpatient Hospital Stay (HOSPITAL_COMMUNITY): Payer: Medicare Other

## 2019-03-28 DIAGNOSIS — R000 Tachycardia, unspecified: Secondary | ICD-10-CM

## 2019-03-28 DIAGNOSIS — Z66 Do not resuscitate: Secondary | ICD-10-CM

## 2019-03-28 DIAGNOSIS — Z515 Encounter for palliative care: Secondary | ICD-10-CM

## 2019-03-28 DIAGNOSIS — I6389 Other cerebral infarction: Secondary | ICD-10-CM

## 2019-03-28 DIAGNOSIS — R1319 Other dysphagia: Secondary | ICD-10-CM

## 2019-03-28 DIAGNOSIS — C50919 Malignant neoplasm of unspecified site of unspecified female breast: Secondary | ICD-10-CM

## 2019-03-28 DIAGNOSIS — R1312 Dysphagia, oropharyngeal phase: Secondary | ICD-10-CM

## 2019-03-28 LAB — CBC
HCT: 30.5 % — ABNORMAL LOW (ref 36.0–46.0)
Hemoglobin: 10 g/dL — ABNORMAL LOW (ref 12.0–15.0)
MCH: 27.9 pg (ref 26.0–34.0)
MCHC: 32.8 g/dL (ref 30.0–36.0)
MCV: 85 fL (ref 80.0–100.0)
Platelets: 223 10*3/uL (ref 150–400)
RBC: 3.59 MIL/uL — ABNORMAL LOW (ref 3.87–5.11)
RDW: 14.7 % (ref 11.5–15.5)
WBC: 11.4 10*3/uL — ABNORMAL HIGH (ref 4.0–10.5)
nRBC: 0 % (ref 0.0–0.2)

## 2019-03-28 LAB — BASIC METABOLIC PANEL
Anion gap: 14 (ref 5–15)
BUN: 23 mg/dL (ref 8–23)
CO2: 19 mmol/L — ABNORMAL LOW (ref 22–32)
Calcium: 8.7 mg/dL — ABNORMAL LOW (ref 8.9–10.3)
Chloride: 104 mmol/L (ref 98–111)
Creatinine, Ser: 1.01 mg/dL — ABNORMAL HIGH (ref 0.44–1.00)
GFR calc Af Amer: >60 mL/min (ref >60)
GFR calc non Af Amer: 58 mL/min — ABNORMAL LOW (ref >60)
Glucose, Bld: 91 mg/dL (ref 70–99)
Potassium: 3.4 mmol/L — ABNORMAL LOW (ref 3.5–5.1)
Sodium: 137 mmol/L (ref 135–145)

## 2019-03-28 LAB — GLUCOSE, CAPILLARY
Glucose-Capillary: 77 mg/dL (ref 70–99)
Glucose-Capillary: 73 mg/dL (ref 70–99)
Glucose-Capillary: 79 mg/dL (ref 70–99)

## 2019-03-28 MED ORDER — WHITE PETROLATUM EX OINT
TOPICAL_OINTMENT | CUTANEOUS | Status: AC
  Administered 2019-03-28: 0.2
  Filled 2019-03-28: qty 28.35

## 2019-03-28 MED ORDER — POTASSIUM CHLORIDE 10 MEQ/100ML IV SOLN
10.0000 meq | INTRAVENOUS | Status: AC
  Administered 2019-03-28 (×4): 10 meq via INTRAVENOUS
  Filled 2019-03-28 (×4): qty 100

## 2019-03-28 MED ORDER — SODIUM CHLORIDE 3 % IN NEBU
4.0000 mL | INHALATION_SOLUTION | RESPIRATORY_TRACT | Status: DC
  Administered 2019-03-28 – 2019-03-31 (×12): 4 mL via RESPIRATORY_TRACT
  Filled 2019-03-28 (×14): qty 4

## 2019-03-28 MED ORDER — METOPROLOL TARTRATE 5 MG/5ML IV SOLN: 2.5000 mg | Freq: Four times a day (QID) | INTRAVENOUS | Status: DC | PRN

## 2019-03-28 MED ORDER — MORPHINE SULFATE (PF) 2 MG/ML IV SOLN: 2.0000 mg | Freq: Four times a day (QID) | INTRAVENOUS | Status: DC | PRN

## 2019-03-28 MED ORDER — SODIUM CHLORIDE 0.9% FLUSH: 10.0000 mL | INTRAVENOUS | Status: DC | PRN

## 2019-03-28 MED ORDER — ACETAMINOPHEN 10 MG/ML IV SOLN
1000.0000 mg | Freq: Three times a day (TID) | INTRAVENOUS | Status: AC
  Administered 2019-03-28 – 2019-03-29 (×3): 1000 mg via INTRAVENOUS
  Filled 2019-03-28 (×3): qty 100

## 2019-03-28 MED ORDER — INSULIN ASPART 100 UNIT/ML ~~LOC~~ SOLN
0.0000 [IU] | SUBCUTANEOUS | Status: DC
  Administered 2019-03-30 (×4): 1 [IU] via SUBCUTANEOUS
  Administered 2019-03-31 (×2): 2 [IU] via SUBCUTANEOUS

## 2019-03-28 NOTE — Progress Notes (Signed)
Assisted tele visit to patient with husband.  Megan Maiorino McEachran, RN  

## 2019-03-28 NOTE — Progress Notes (Signed)
Assisted tele visit to patient with husband.  Jesselee Poth M Eathan Groman, RN   

## 2019-03-28 NOTE — Progress Notes (Signed)
PT Cancellation Note  Patient Details Name: Megan Hancock MRN: [id number] DOB: June 14, 1951   Cancelled Treatment:    Reason Eval/Treat Not Completed: Patient at procedure or test/unavailable (placing midline).  Ellamae Sia, PT, DPT Acute Rehabilitation Services Pager [phone] Office [phone]    Willy Eddy 03/28/2019, 3:35 PM

## 2019-03-28 NOTE — Progress Notes (Signed)
  Speech Language Pathology Treatment:    Patient Details Name: Megan Hancock MRN: [id number] DOB: 10-23-1951 Today's Date: 03/28/2019 Time: 6803-2122 SLP Time Calculation (min) (ACUTE ONLY): 11 min  Assessment / Plan / Recommendation Clinical Impression  Overall, she is similar in presentation to yesterday. Perhaps a bit more eye contact and awareness of SLP. Intermittently attempted to masticate ice chip. Mostly melted in mouth, one swallow elicited. Cup sip water resulted in explosive type immediate cough as yesterday. She is somewhat resistant to oral care. Hard palate   Therapist was unable to elicit command following. She purposefully reached for the cup of water in front of her, brought to mouth. Verbal/visual and tactile assist given to elicit additional responses unsuccessfully. Palliative work up in progress. ST will continue to follow to provide communicative/swallow support.   HPI HPI: Megan Hancock is a 68 y.o. 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 MRI Large area of acute infarction roughly corresponding to the posterior/inferior division LEFT MCA, proximal M2 occlusion, Intracranial hemorrhage following reperfusion therapy consisting of both parenchymal hematoma, as well as subarachnoid hemorrhage.      SLP Plan  Continue with current plan of care       Recommendations  Diet recommendations: NPO Medication Administration: Via alternative means                Oral Care Recommendations: Oral care QID Follow up Recommendations: (TBD) SLP Visit Diagnosis: Aphasia (R47.01);Dysphagia, unspecified (R13.10) Plan: Continue with current plan of care                       Houston Siren 03/28/2019, 2:57 PM   Orbie Pyo Colvin Caroli.Ed Risk analyst  [phone] Office [phone]

## 2019-03-28 NOTE — Progress Notes (Signed)
STROKE TEAM PROGRESS NOTE   INTERVAL HISTORY Pt husband and PCM NP Mary at beside. We had long discussion about pt current condition, treatment plan and poor prognosis. Husband has difficult time for decision but ok with DNR. [facility] Kidney is going to call pt oncologist in [place] for prognosis. Pt still has secretions and tachycardia. Will start breathing treatment and add metoprolol PRN.     Vitals:   03/28/19 0500 03/28/19 0600 03/28/19 0700 03/28/19 0800  BP: 127/82 134/74 139/88   Pulse: (!) 114 (!) 102 (!) 107   Resp: (!) 25 18 17    Temp:    98.7 F (37.1 C)  TempSrc:    Axillary  SpO2: 95% 97% 94%   Height:        CBC:  Recent Labs  Lab 04/17/2019 0726  03/26/19 0146 03/27/19 0224 03/28/19 0237  WBC 12.9*  --  12.1* 12.4* 11.4*  NEUTROABS 9.5*  --  10.3*  --   --   HGB 11.2*   < > 9.8* 9.9* 10.0*  HCT 35.3*   < > 31.4* 30.5* 30.5*  MCV 86.7  --  87.2 86.2 85.0  PLT 256  --  251 224 223   < > = values in this interval not displayed.    Basic Metabolic Panel:  Recent Labs  Lab 03/27/19 0224 03/28/19 0237  NA 138 137  K 4.1 3.4*  CL 107 104  CO2 19* 19*  GLUCOSE 98 91  BUN 19 23  CREATININE 0.92 1.01*  CALCIUM 8.6* 8.7*   Lipid Panel:     Component Value Date/Time   CHOL 97 03/26/2019 0146   TRIG 98 03/26/2019 0146   HDL 39 (L) 03/26/2019 0146   CHOLHDL 2.5 03/26/2019 0146   VLDL 20 03/26/2019 0146   LDLCALC 38 03/26/2019 0146   HgbA1c:  Lab Results  Component Value Date   HGBA1C 5.7 (H) 03/26/2019   Urine Drug Screen: No results found for: LABOPIA, COCAINSCRNUR, LABBENZ, AMPHETMU, THCU, LABBARB  Alcohol Level No results found for: [facility]  IMAGING Mr Brain Wo Contrast  Result Date: 03/26/2019 CLINICAL DATA:  Status post CVA with thrombectomy. RIGHT-sided weakness. EXAM: MRI HEAD WITHOUT CONTRAST TECHNIQUE: Multiplanar, multiecho pulse sequences of the brain and surrounding structures were obtained without intravenous contrast. COMPARISON:  CTA and CTP 04/14/2019.  Catheter angiogram 03/29/2019 with postprocedure CT. MR brain and MRA 12/20/2018 FINDINGS: Brain: Large area of restricted diffusion, corresponding low ADC, largely corresponding to the posterior inferior division of the LEFT MCA. The area of infarction roughly corresponds to the penumbra observed on prior CTP, slightly greater. There is involvement of the insula, temporal lobe, frontal lobe, parietal lobe, and occipital lobe, as well as periventricular white matter Ovoid parenchymal hemorrhage, epicenter LEFT insula and external capsule, approximate measurements 27 x 44 x 25 mm, corresponding volume 15 mL. Subarachnoid hemorrhage within the LEFT sylvian fissure and over the LEFT cerebral convexity is also noted, and corresponds with the postprocedure CT. Mild mass effect LEFT-to-RIGHT at the septum pellucidum level, 3 mm. Chronic RIGHT parietal infarction with encephalomalacia, was acute in February 2020. Vascular: Proximal flow voids are maintained, including LEFT M1 MCA. Skull and upper cervical spine: Unremarkable calvarium and skull base. Sebaceous cyst RIGHT frontal scalp. Sinuses/Orbits: Negative sinuses.  BILATERAL cataract extraction. Other: None. IMPRESSION: Large area of acute infarction roughly corresponding to the posterior/inferior division LEFT MCA, proximal M2 occlusion. Intracranial hemorrhage following reperfusion therapy consisting of both parenchymal hematoma occupying roughly 30% of the infarcted tissue  with mass effect, as well as subarachnoid hemorrhage. Heidelburg classification PH2 and 3C. 3 mm LEFT-to-RIGHT mass effect at septum pellucidum. Continued surveillance is warranted. Electronically Signed   By: Staci Righter M.D.   On: 03/26/2019 13:31   Dg Chest Port 1 View  Result Date: 03/27/2019 CLINICAL DATA:  CVA.  Endotracheal tube removal. EXAM: PORTABLE CHEST 1 VIEW COMPARISON:  03/26/2019 FINDINGS: The endotracheal tube has been removed. Associated mild hypoaeration with low lung  volumes, vascular crowding and streaky bibasilar atelectasis. No edema or definite effusions. IMPRESSION: Hypoaeration status post extubation with low lung volumes and streaky bibasilar atelectasis. Electronically Signed   By: Marijo Sanes M.D.   On: 03/27/2019 12:16   Vas [place] Lower Extremity Venous (dvt)  Result Date: 03/27/2019  Lower Venous Study Indications: Stroke.  Limitations: Patient positioning, patient immobility. Performing Technologist: Oliver Hum RVT  Examination Guidelines: A complete evaluation includes B-mode imaging, spectral Doppler, color Doppler, and power Doppler as needed of all accessible portions of each vessel. Bilateral testing is considered an integral part of a complete examination. Limited examinations for reoccurring indications may be performed as noted.  +---------+---------------+---------+-----------+----------+-------+ RIGHT    CompressibilityPhasicitySpontaneityPropertiesSummary +---------+---------------+---------+-----------+----------+-------+ CFV      Full           Yes      Yes                          +---------+---------------+---------+-----------+----------+-------+ SFJ      Full                                                 +---------+---------------+---------+-----------+----------+-------+ FV Prox  Full                                                 +---------+---------------+---------+-----------+----------+-------+ FV Mid   Full                                                 +---------+---------------+---------+-----------+----------+-------+ FV DistalFull                                                 +---------+---------------+---------+-----------+----------+-------+ PFV      Full                                                 +---------+---------------+---------+-----------+----------+-------+ POP      Full           Yes      Yes                           +---------+---------------+---------+-----------+----------+-------+ PTV      Full                                                 +---------+---------------+---------+-----------+----------+-------+  PERO     Full                                                 +---------+---------------+---------+-----------+----------+-------+   +---------+---------------+---------+-----------+----------+-------+ LEFT     CompressibilityPhasicitySpontaneityPropertiesSummary +---------+---------------+---------+-----------+----------+-------+ CFV      Full           Yes      Yes                          +---------+---------------+---------+-----------+----------+-------+ SFJ      None                                         Acute   +---------+---------------+---------+-----------+----------+-------+ FV Prox  Full                                                 +---------+---------------+---------+-----------+----------+-------+ FV Mid   Full                                                 +---------+---------------+---------+-----------+----------+-------+ FV DistalFull                                                 +---------+---------------+---------+-----------+----------+-------+ PFV      Full                                                 +---------+---------------+---------+-----------+----------+-------+ POP      Full           Yes      Yes                          +---------+---------------+---------+-----------+----------+-------+ PTV      Partial                                      Acute   +---------+---------------+---------+-----------+----------+-------+ PERO     Full                                                 +---------+---------------+---------+-----------+----------+-------+ GSV      None                                         Acute   +---------+---------------+---------+-----------+----------+-------+ SVT at [facility] in  the GSV.    Summary: Right: There is no evidence  of deep vein thrombosis in the lower extremity. No cystic structure found in the popliteal fossa. Left: Findings consistent with acute deep vein thrombosis involving the left posterior tibial vein. Findings consistent with acute superficial vein thrombosis involving the left great saphenous vein. No cystic structure found in the popliteal fossa.  *See table(s) above for measurements and observations.    Preliminary    Cerebral angiogram  partial revascularization of occluded Lt MCA  Inf division with x 2 passes with 69mm x 36mm embotrap retriever device  And penumbra aspiration achieving a TICI 2a/2b revascularization  2D Echocardiogram  1. The left ventricle has normal systolic function with an ejection fraction of 60-65%. The cavity size was normal. Left ventricular diastolic Doppler parameters are consistent with pseudonormalization. No evidence of left ventricular regional wall  motion abnormalities.  2. The right ventricle has normal systolic function. The cavity was normal. There is no increase in right ventricular wall thickness.  3. The pericardial effusion is posterior to the left ventricle.  4. Trivial pericardial effusion is present.  5. There is mild to moderate mitral annular calcification present.  6. The aortic valve is tricuspid. Moderate stenosis of the aortic valve.  7. The aortic root and ascending aorta are normal in size and structure.  8. The interatrial septum was not well visualized.   PHYSICAL EXAM  Temp:  [98.1 F (36.7 C)-99 F (37.2 C)] 98.7 F (37.1 C) (06/04 0800) Pulse Rate:  [99-129] 107 (06/04 0700) Resp:  [16-36] 17 (06/04 0700) BP: (101-153)/(61-88) 139/88 (06/04 0700) SpO2:  [91 %-98 %] 94 % (06/04 0700)  General - Well nourished, well developed, mildly restless.  Ophthalmologic - fundi not visualized due to noncooperation.  Cardiovascular - Regular hythm, but tachycardia.  Neuro - mildly restless,  could be related to her back pain at cancer metastasis site, eyes open, not following commands except intermittent close eyes on command, nonverbal. Eyes in left gaze position, inconsistently blinking to visual threat on the left, not blinking to visual threat on the right, not tracking on the right, PERRL. Right facial droop, tongue midline in mouth. Spontaneous raising up LUE at least 3+/5 but with pain stimulation LLE 2+/5 and RLE with mild withdraw, no movement of RUE. DTR diminished and no babinski. Sensation, coordination and gait not tested.   ASSESSMENT/PLAN Ms. KADESHIA KASPARIAN is a 68 y.o. 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  Out of the TPA window.  Found to have a left M2 LVO transferred to [facility] and taken to IR.   Stroke:   L MCA infarct s/p parital IR L M2 with post revascularization hemorrhage and cerebral edema - embolic could be secondary to hypercoagulable state from metastatic breast cancer   Code Stroke CT head Acute L MCA infarct. Possible hyperdense L MCA branch. Expected evolution poster R MCA infarct since Feb.  CTA head & neck partially visible malignancy upper chest and neck w/ lymphadenopathy and 3-4 cm  L mediastinal mass. L M2 ELVO  CT perfusion 66mL core infarct w/ 67mL penumbra and hypoperfusion index 0.5  Cerebral angio L MCA M2 occlusion with TICI 2a/2b revascularization   MRI large left MCA infarct due to proximal M2 occlusion.  Intracranial hemorrhage following reperfusion consisting of both IPH of 30% of infarcted tissue as well as SAH, pH 2 and 3C.   2D Echo EF 60-65%. No source of embolus  11/2018 TEE showed no PFO  Loop recorder placed 12/31/2008 interrogation pending   LDL 38  HgbA1c 5.7  SCDs for VTE prophylaxis  aspirin 81 mg daily prior to admission, now on No antithrombotic given post revascularization hemorrhage  Therapy  recommendations: Pending  Disposition: Pending  Palliative care on board and husband agreed with DNR but hard to make other decisions  Acute respiratory failure  COPD  Intubated for IR in ED   Extubated 6/2 w/ HR 100-110s afterwards  CTA neck showed partially visible malignancy upper chest and neck w/ lymphadenopathy and 3-4 cm  L mediastinal mass.  CCM on board  Received lasix once   Chest PT  3% saline nebulization  Left LE DVT  LE venous doppler - acute DVT left posterior tibial vein  Likely related to advanced cancer  No AC at this time due to bleeding and distal DVT  Close monitoring  Primary breast cancer with metastasis  new diagnosis, for evaluation by oncology on 03/27/2019 in [place]  CTA neck showed partially visible malignancy upper chest and neck w/ lymphadenopathy and 3-4 cm  L mediastinal mass.  Baseline dyspnea likely caused by metastatic disease  Hx stroke/TIA  06/4173 -embolic right MCA infarct with resultant short-term memory deficits and imbalance, TEE neg, loop recorder placed, put on aspirin and statin  Fever  T-max 100.6->afebrile  Leukocytosis, likely reactive 12.1->12.4->11.4  UA negative  Urine culture no growth  Blood culture no growth for 2 days  CXR Persistent fullness in the left hilum due to cancer  Tachycardia  BP 110s  Could be related to pain at back at the metastasis site as per husband  Will put on morphine low dose PRN  Will put on metoprolol PRN  Blood pressure   Home meds: None  BP stable  . BP goal < 160 given hemorrhagic conversion  Hyperlipidemia  Home meds: Zocor 40  LDL 38, goal < 70  Resume half-strength Zocor once able to swallow   Continue statin at discharge  Dysphagia  Secondary to stroke  Failed swallow  Currently NPO  Speech to follow  On gentle IVF  Husband is OK to consider cortrak if needed  Other Stroke Risk Factors  Advanced age  History ETOH use  Family  hx stroke (daughter)  Other Active Problems  Acute blood loss anemia post IR Hgb 9.8->10  Hypokalemia 4.1->3.4 - supplement  Acute urinary retention, foley inserted  Hospital day # 3  This patient is critically ill due to large left MCA stroke, hemorrhagic conversion, respiratory failure, history of stroke, metastatic breast cancer, and fever and at significant risk of neurological worsening, death form recurrent stroke, hematoma expansion, cerebral edema, brain herniation, seizure, sepsis. This patient's care requires constant monitoring of vital signs, hemodynamics, respiratory and cardiac monitoring, review of multiple databases, neurological assessment, discussion with family, other specialists and medical decision making of high complexity. I spent 40 minutes of neurocritical care time in the care of this patient. I again had long discussion with husband at bedside, updated pt current condition, treatment plan and poor prognosis.  He expressed understanding and appreciation. However, he still had difficult time to make decisions. I also discussed with palliative care NP [facility].     Rosalin Hawking, MD PhD Stroke Neurology 03/28/2019 9:01 AM   To contact Stroke Continuity provider, please refer to [email or web address] After hours, contact General Neurology

## 2019-03-28 NOTE — Care Management (Signed)
CM consult to assist with obtaining medical records from [place].  Pt's husband signed medical release forms for Dr. Duke Salvia and Dr. Ricard Dillon' records to be faxed to [facility].  I have asked that the records be faxed to the unit fax machine at [phone].  Faxed medical release forms to [phone], medical records/release of information dept.    Reinaldo Raddle, RN, BSN  Trauma/Neuro ICU Case Manager [phone]

## 2019-03-28 NOTE — Consult Note (Signed)
Consultation Note Date: 03/28/2019   Patient Name: Megan Hancock  DOB: 1951/09/24  MRN: [id number]  Age / Sex: 68 y.o., female  PCP: Patient, No Pcp Per Referring Physician: Rosalin Hawking, MD  Reason for Consultation: Establishing goals of care and Psychosocial/spiritual support  HPI/Patient Profile: 68 y.o. 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    Patient required intubation but is currently stable off vent.  She is having some difficulty clearing her own secretions and did not pass swallow study yesterday with a moderate to severe aspiration risk.  I spoke to the patient's initial Oncology office with the Dr. Earl Lagos # [phone].  Dr. Estill Bamberg is a out of the office until Monday however her NP Sharyn Lull spoke to me about this patient. They spoke to an aggressive triple negative breast cancer.  Scans showed extensive subcutaneous nodules and PET scan was significant for a left hilar mass and an S1 vertebral body mass.  Unfortunately there is an associated poor prognosis with this disease type.   (I spoke to case manager regarding securing medical records)   [facility] (cancer clinic)   Dr Priscille Kluver    # [phone] Primary Care Provider   Dr Bethena Midget     [phone]  Patient prior to this was independent and had no residual deficits from her previous right MCA stroke. They have three children.  Diagnosis of breast cancer was recent and patient has still not started therapy, it was to be started on June 2nd.    Patient and her husband spend most of their time in [place] where they have their main home.  This is where oncologic treatment was to take place.  Unfortunately  this vascular event took place here at their home in [place] where they were visiting family.  Patient is unable to participate in medical decisions at this time.  Her husband and family face treatment option decisions, advanced directive decisions and anticipatory care needs.  Clinical Assessment and Goals of Care:  This NP Wadie Lessen reviewed medical records, received report from team, assessed the patient and then meet at the patient's bedside along with her husband/ [name] Asmus/ # [phone]  to discuss diagnosis, prognosis, GOC, EOL wishes disposition and options.  Concept of Hospice and Palliative Care were discussed  A discussion was had today regarding advanced directives.  Concepts specific to code status, artifical feeding and hydration, continued IV antibiotics and rehospitalization was had.  The difference between a aggressive medical intervention path  and a palliative comfort care path for this patient at this time was had.  Values and goals of care important to patient and family were attempted to be elicited.  We discussed the significance of this serious CVA on her eligibility for chemotherapy and other oncologic treatments. Husband is struggling with decisions related  to goals of care and end-of-life wishes not knowing a definitive prognosis.   Concepts related to human mortality and the limitations of medical interventions to prolong quality of life when the body begins to fail to thrive.  We discussed her high risk for decompensation secondary to her weakness/inability to clear secretions, dysphagia, aphasia, bedbound status and over all failure to thrive.  Natural trajectory and expectations at EOL were discussed.  Questions and concerns addressed.   Hard Choices booklet left for review    PMT will continue to support holistically.  Family encouraged to call with questions or concerns.       There is no documented healthcare power of attorney or advanced  directive.  Her husband is her main Media planner.  Mr. Leite shared with me that his wife never discussed her thoughts or feelings regarding advanced directives or end-of-life wishes.  She would tell him that she "did not want to talk about it"     SUMMARY OF RECOMMENDATIONS     Code Status/Advance Care Planning:  DNR- documented today  Family would be open to trial of artificial feeding/core track   Palliative Prophylaxis:   Aspiration, Bowel Regimen, Delirium Protocol, Frequent Pain Assessment and Oral Care  Additional Recommendations (Limitations, Scope, Preferences):  Full Scope Treatment  Psycho-social/Spiritual:   Desire for further Chaplaincy support:no  Additional Recommendations: Education on Hospice  Prognosis:   Unable to determine  Discharge Planning: To Be Determined      Primary Diagnoses: Present on Admission: . Acute ischemic left MCA stroke ([place]) . Middle cerebral artery embolism, left   I have reviewed the medical record, interviewed the patient and family, and examined the patient. The following aspects are pertinent.  Past Medical History:  Diagnosis Date  . COPD (chronic obstructive pulmonary disease) ([place])   . Hypercholesteremia   . IBS (irritable bowel syndrome)   . Primary breast cancer with metastasis to other site [facility])    Social History   Socioeconomic History  . Marital status: Married    Spouse name: Not on file  . Number of children: Not on file  . Years of education: Not on file  . Highest education level: Not on file  Occupational History  . Not on file  Social Needs  . Financial resource strain: Not on file  . Food insecurity:    Worry: Not on file    Inability: Not on file  . Transportation needs:    Medical: Not on file    Non-medical: Not on file  Tobacco Use  . Smoking status: Never Smoker  . Smokeless tobacco: Never Used  Substance and Sexual Activity  . Alcohol use: Not Currently  . Drug use: Not on  file  . Sexual activity: Not on file  Lifestyle  . Physical activity:    Days per week: Not on file    Minutes per session: Not on file  . Stress: Not on file  Relationships  . Social connections:    Talks on phone: Not on file    Gets together: Not on file    Attends religious service: Not on file    Active member of club or organization: Not on file    Attends meetings of clubs or organizations: Not on file    Relationship status: Not on file  Other Topics Concern  . Not on file  Social History Narrative  . Not on file   Family History  Problem Relation Age of Onset  . Stroke Daughter  Scheduled Meds: .  stroke: mapping our early stages of recovery book   Does not apply Once  . chlorhexidine  15 mL Mouth Rinse BID  . Chlorhexidine Gluconate Cloth  6 each Topical Daily  . mouth rinse  15 mL Mouth Rinse QID  . pantoprazole (PROTONIX) IV  40 mg Intravenous Daily   Continuous Infusions: . sodium chloride 20 mL/hr at 03/28/19 0700   PRN Meds:.acetaminophen **OR** acetaminophen (TYLENOL) oral liquid 160 mg/5 mL **OR** acetaminophen, nitroGLYCERIN, senna-docusate Medications Prior to Admission:  Prior to Admission medications   Medication Sig Start Date End Date Taking? Authorizing Provider  aspirin 81 MG chewable tablet Chew 1 tablet (81 mg total) by mouth daily. 12/21/18   Bettey Costa, MD  calcium carbonate (OSCAL) 1500 (600 Ca) MG TABS tablet Take 600 mg of elemental calcium by mouth daily.    [provider]  Cholecalciferol (VITAMIN D) 50 MCG (2000 UT) tablet Take 2,000 Units by mouth daily.    [provider]  Fiber POWD Take 5 g by mouth daily.    [provider]  fluticasone (FLONASE) 50 MCG/ACT nasal spray Place 1-2 sprays into both nostrils daily.    [provider]  mometasone (NASONEX) 50 MCG/ACT nasal spray Place 2 sprays into the nose daily as needed (nasal congestion).    [provider]  mometasone-formoterol  (DULERA) 100-5 MCG/ACT AERO Inhale 2 puffs into the lungs 2 (two) times daily.    [provider]  montelukast (SINGULAIR) 10 MG tablet Take 10 mg by mouth every evening.    [provider]  omeprazole (PRILOSEC) 40 MG capsule Take 40 mg by mouth daily.    [provider]  simvastatin (ZOCOR) 40 MG tablet Take 1 tablet (40 mg total) by mouth every evening. 12/21/18   Bettey Costa, MD  tiotropium (SPIRIVA) 18 MCG inhalation capsule Place 18 mcg into inhaler and inhale daily.    [provider]  valACYclovir (VALTREX) 1000 MG tablet Take 1 g by mouth every 8 (eight) hours. 02/21/19   [provider]   No Known Allergies Review of Systems  Unable to perform ROS: Acuity of condition    Physical Exam Constitutional:      Appearance: She is ill-appearing.  Cardiovascular:     Rate and Rhythm: Tachycardia present.  Skin:    General: Skin is warm and dry.  Neurological:     Comments: Dense right sided weakness      Vital Signs: BP 139/88   Pulse (!) 107   Temp 98.1 F (36.7 C) (Axillary)   Resp 17   Ht 5\' 6"  (1.676 m)   SpO2 94%   BMI 26.31 kg/m  Pain Scale: PAINAD       SpO2: SpO2: 94 % O2 Device:SpO2: 94 % O2 Flow Rate: .O2 Flow Rate (L/min): 3 L/min  IO: Intake/output summary:   Intake/Output Summary (Last 24 hours) at 03/28/2019 0749 Last data filed at 03/28/2019 0700 Gross per 24 hour  Intake 596.05 ml  Output 2450 ml  Net -1853.95 ml    LBM: Last BM Date: (PTA) Baseline Weight:   Most recent weight:       Palliative Assessment/Data: 20 %   Discussed with Dr  Erlinda Hong  and bedside RN/Elaine  Ongoing discussion with husband through out the afternoon.  He plans to return today to the hospital to sign release form in order to obtain medical records regarding patient's oncologic diagnosis.      Discussed with Julie/  CMRN and Margart Sickles  I let husband and doctors you know that I would not be in the hospital until Monday again  however my colleague Elie Goody NP   [phone]) will follow up with this patient and family for palliative medicine needs and emotional support.  Time In: 0830 Time Out: 1030 Time Total: 120 minutes Greater than 50%  of this time was spent counseling and coordinating care related to the above assessment and plan.  Signed by: Wadie Lessen, NP   Please contact Palliative Medicine Team phone at [phone] for questions and concerns.  For individual provider: See Shea Evans

## 2019-03-29 DIAGNOSIS — C7989 Secondary malignant neoplasm of other specified sites: Secondary | ICD-10-CM

## 2019-03-29 DIAGNOSIS — R1310 Dysphagia, unspecified: Secondary | ICD-10-CM

## 2019-03-29 DIAGNOSIS — Z66 Do not resuscitate: Secondary | ICD-10-CM

## 2019-03-29 DIAGNOSIS — Z515 Encounter for palliative care: Secondary | ICD-10-CM

## 2019-03-29 DIAGNOSIS — R1319 Other dysphagia: Secondary | ICD-10-CM

## 2019-03-29 DIAGNOSIS — C801 Malignant (primary) neoplasm, unspecified: Secondary | ICD-10-CM

## 2019-03-29 LAB — BASIC METABOLIC PANEL
Anion gap: 15 (ref 5–15)
BUN: 29 mg/dL — ABNORMAL HIGH (ref 8–23)
CO2: 18 mmol/L — ABNORMAL LOW (ref 22–32)
Calcium: 8.7 mg/dL — ABNORMAL LOW (ref 8.9–10.3)
Chloride: 107 mmol/L (ref 98–111)
Creatinine, Ser: 1 mg/dL (ref 0.44–1.00)
GFR calc Af Amer: >60 mL/min (ref >60)
GFR calc non Af Amer: 59 mL/min — ABNORMAL LOW (ref >60)
Glucose, Bld: 81 mg/dL (ref 70–99)
Potassium: 3.8 mmol/L (ref 3.5–5.1)
Sodium: 140 mmol/L (ref 135–145)

## 2019-03-29 LAB — CBC
HCT: 30.6 % — ABNORMAL LOW (ref 36.0–46.0)
Hemoglobin: 9.7 g/dL — ABNORMAL LOW (ref 12.0–15.0)
MCH: 27.1 pg (ref 26.0–34.0)
MCHC: 31.7 g/dL (ref 30.0–36.0)
MCV: 85.5 fL (ref 80.0–100.0)
Platelets: 240 10*3/uL (ref 150–400)
RBC: 3.58 MIL/uL — ABNORMAL LOW (ref 3.87–5.11)
RDW: 14.5 % (ref 11.5–15.5)
WBC: 13.1 10*3/uL — ABNORMAL HIGH (ref 4.0–10.5)
nRBC: 0 % (ref 0.0–0.2)

## 2019-03-29 LAB — GLUCOSE, CAPILLARY
Glucose-Capillary: 71 mg/dL (ref 70–99)
Glucose-Capillary: 90 mg/dL (ref 70–99)
Glucose-Capillary: 74 mg/dL (ref 70–99)
Glucose-Capillary: 68 mg/dL — ABNORMAL LOW (ref 70–99)
Glucose-Capillary: 84 mg/dL (ref 70–99)
Glucose-Capillary: 86 mg/dL (ref 70–99)
Glucose-Capillary: 121 mg/dL — ABNORMAL HIGH (ref 70–99)

## 2019-03-29 LAB — PHOSPHORUS
Phosphorus: 3.4 mg/dL (ref 2.5–4.6)
Phosphorus: 3.5 mg/dL (ref 2.5–4.6)

## 2019-03-29 LAB — MAGNESIUM
Magnesium: 2 mg/dL (ref 1.7–2.4)
Magnesium: 2 mg/dL (ref 1.7–2.4)

## 2019-03-29 MED ORDER — MORPHINE SULFATE (CONCENTRATE) 10 MG/0.5ML PO SOLN
5.0000 mg | ORAL | Status: DC | PRN
  Administered 2019-03-29 – 2019-03-30 (×4): 5 mg via ORAL
  Filled 2019-03-29 (×5): qty 0.5

## 2019-03-29 MED ORDER — OSMOLITE 1.2 CAL PO LIQD
1000.0000 mL | ORAL | Status: DC
  Administered 2019-03-29: 1000 mL
  Filled 2019-03-29 (×5): qty 1000

## 2019-03-29 MED ORDER — PRO-STAT SUGAR FREE PO LIQD
30.0000 mL | Freq: Two times a day (BID) | ORAL | Status: DC
  Administered 2019-03-29 – 2019-03-30 (×4): 30 mL
  Filled 2019-03-29 (×4): qty 30

## 2019-03-29 MED ORDER — PANTOPRAZOLE SODIUM 40 MG PO PACK
40.0000 mg | PACK | Freq: Every day | ORAL | Status: DC
  Administered 2019-03-29 – 2019-03-30 (×2): 40 mg
  Filled 2019-03-29 (×2): qty 20

## 2019-03-29 MED ORDER — HEPARIN SODIUM (PORCINE) 5000 UNIT/ML IJ SOLN
5000.0000 [IU] | Freq: Three times a day (TID) | INTRAMUSCULAR | Status: DC
  Administered 2019-03-29 – 2019-03-30 (×5): 5000 [IU] via SUBCUTANEOUS
  Filled 2019-03-29 (×5): qty 1

## 2019-03-29 MED ORDER — PANTOPRAZOLE SODIUM 40 MG PO TBEC: 40.0000 mg | DELAYED_RELEASE_TABLET | Freq: Every day | ORAL | Status: DC

## 2019-03-29 NOTE — Progress Notes (Signed)
SLP Cancellation Note  Patient Details Name: Megan Hancock MRN: [id number] DOB: 08-16-1951   Cancelled treatment:        Initial plan with Palliative/husband was to see how pt did over weekend and if alertness or swallowing not improved, place PEG. RN informed this therapist that records were received indicating the severity of her cancer and poor prognosis, therefore Palliative care will meet with husband again tomorrow. In agreement with RN to hold off until outcome of second Palliative meeting known. Will not plan to return until Monday unless needed sooner in which please contact pager [phone] over the weekend.   Houston Siren 03/29/2019, 4:52 PM   Orbie Pyo Colvin Caroli.Ed Risk analyst [phone] Office [phone]

## 2019-03-29 NOTE — Progress Notes (Signed)
Daily Progress Note   Patient Name: Megan Hancock       Date: 03/29/2019 DOB: 12-09-1950  Age: 68 y.o. MRN#: [id number] Attending Physician: Rosalin Hawking, MD Primary Care Physician: Patient, No Pcp Per Admit Date: 04/03/2019  Reason for Consultation/Follow-up: Establishing goals of care, Pain control and Psychosocial/spiritual support  Subjective: I met at bedside with Mr. Josue Hector.  He feels Terri more alert and responsive today.  He comments "she said a word!".  He is very encouraged by this.  We talked about his goals for her.  He does not want to place her in a nursing facility.  He has full intention of caring for her in his home.  (Today he said the home in [place] rather than in [place]).    [name] is being very deliberate and thoughtful about goals for Terri.  He wants to get more information about her recent metastatic cancer diagnosis - that she just received 10 days ago.  The records have been requested from [facility].  He would like to understand her prognosis and anticipate what will happen over the coming months before making decisions.  Robert read the "Hard Choices" book last night.  He now understands comfort and dignity.  He digested the chapter on feeding tubes.  I believe he is educating himself well to be an excellent advocate for Terri.  He speaks to wanting Terri to have comfort and dignity.  He also wants her alive and happy as long as possible.  He would like to have a cor trak placed today.   We discussed that Karna Christmas will not be able to leave the hospital unless she has a plan for nutrition - either she can eat, she has a PEG, or she's hospice care (end of life).  Robert understands.  Terri appears somewhat agitated and continues to shift around in bed as though she  is uncomfortable.  Rob is concerned about the painful cancer tumors that she has in her mediastinum and back.  We discussed using sublingual morphine at home (in addition to tylenol) for pain.  We will trial the morphine here in the hospital.  [name] asks Karna Christmas if she is hungry she nods yes.  He then stated "we will have to put in a feeding tube to give you food and medicine"  Terri shook her head NO very clearly.  This took Rob back a step.  He gently explained to her that she needs it.   Assessment: Patient awake alert, mildly agitated (frustrated?).  At times she appears to respond appropriately and at other times she does not.  She is NPO with severe aspiration risk.   Patient Profile/HPI:  68 y.o. 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  Transferred from [facility] with expressive aphasia and new L CVA with R hemiplegia and R neglect.    Patient required intubation but is currently stable off vent.  She is having some difficulty clearing her own secretions and did not pass swallow study yesterday with a moderate to severe aspiration risk.  Length of Stay: 4  Current Medications: Scheduled Meds:  .  stroke: mapping our early stages of recovery book   Does not apply Once  . chlorhexidine  15 mL Mouth Rinse BID  . Chlorhexidine Gluconate Cloth  6 each Topical Daily  . feeding supplement (PRO-STAT SUGAR FREE 64)  30 mL Per Tube BID  . heparin injection (subcutaneous)  5,000 Units Subcutaneous Q8H  . insulin aspart  0-9 Units Subcutaneous Q4H  . mouth rinse  15 mL Mouth Rinse QID  . pantoprazole sodium  40 mg Per Tube Daily  . sodium chloride HYPERTONIC  4 mL Nebulization Q4H while awake    Continuous Infusions: . sodium chloride 25 mL/hr at 03/29/19 1300  . feeding supplement (OSMOLITE 1.2 CAL) 1,000 mL (03/29/19 1253)    PRN Meds: acetaminophen **OR** acetaminophen  (TYLENOL) oral liquid 160 mg/5 mL **OR** acetaminophen, morphine CONCENTRATE, senna-docusate, sodium chloride flush  Physical Exam        Well developed female, awake, essentially non-verbal (she did make a groan), in restraints continually shifting CV rrr resp no distress, bronchial sounding cough that her husband says is her COPD Abdomen soft, nt, nd  Vital Signs: BP 128/81   Pulse 90   Temp 98.8 F (37.1 C) (Axillary)   Resp 15   Ht '5\' 6"'  (1.676 m)   SpO2 96%   BMI 26.31 kg/m  SpO2: SpO2: 96 % O2 Device: O2 Device: Room Air O2 Flow Rate: O2 Flow Rate (L/min): 3 L/min  Intake/output summary:   Intake/Output Summary (Last 24 hours) at 03/29/2019 1342 Last data filed at 03/29/2019 1300 Gross per 24 hour  Intake 2018.16 ml  Output 660 ml  Net 1358.16 ml   LBM: Last BM Date: (PTA) Baseline Weight:   Most recent weight:         Palliative Assessment/Data:   20%    Flowsheet Rows     Most Recent Value  Intake Tab  Referral Department  Critical care  Unit at Time of Referral  Med/Surg Unit  Palliative Care Primary Diagnosis  Cancer  Date Notified  03/27/19  Palliative Care Type  New Palliative care  Reason for referral  Clarify Goals of Care  Date of Admission  04/03/2019  Date first seen by Palliative Care  03/27/19  # of days Palliative referral response time  0 Day(s)  # of days IP prior to Palliative referral  2  Clinical Assessment  Psychosocial & Spiritual Assessment  Palliative Care Outcomes      Patient Active Problem List   Diagnosis Date Noted  . Palliative care by specialist   . DNR (do not resuscitate)   . Metastatic  breast cancer ([place])   . Other dysphagia   . Cerebrovascular accident (CVA) ([place])   . Acute ischemic left MCA stroke ([place]) 04/02/2019  . Middle cerebral artery embolism, left 03/29/2019  . Acute respiratory failure ([place])   . Acute ischemic stroke [facility]) 12/19/2018    Palliative Care Plan    Recommendations/Plan:  Records obtained  from [place] Oncologist.  BRCA is advanced and agressive  I will review these [name] tomorrow and leave a copy on the hard chart.  This is a poorly differentiated metastatic adenocarcinoma of unknown primary with mets in chest, bone, breast and painful subcutaneous nodules diffusely thru out her body.  Per Oncology she would need and ECOG status of at least 2 or better in order to receive treatment.  Dr. Estill Bamberg candidly stated, "if we were able to give chemo - I'd be giving blind chemo there would be a 20-30% chance of response".  Her recommendation was for Palliative Care.    PMT will review this information with husband, [name], on 03/30/2019.  Goals of Care and Additional Recommendations:  Limitations on Scope of Treatment: Full Scope Treatment  Code Status:  DNR  Prognosis:   Weeks to months if she is able to receive nutrition.  Two weeks or less if she does not have a method of receiving nutrition  Discharge Planning:  To Be Determined.  Likely home with home health or home with hospice.  Care plan was discussed with Judson Roch, ICU RN, attending MD, and husband.  Thank you for allowing the Palliative Medicine Team to assist in the care of this patient.  Total time spent:  90 min Time in 9:30 Time out 10:30 Time in:  2:30 Time out:  3:00      Greater than 50%  of this time was spent counseling and coordinating care related to the above assessment and plan.  Florentina Jenny, PA-C Palliative Medicine  Please contact Palliative MedicineTeam phone at [phone] for questions and concerns between 7 am - 7 pm.   Please see AMION for individual provider pager numbers.

## 2019-03-29 NOTE — Progress Notes (Signed)
Initial Nutrition Assessment RD working remotely.  DOCUMENTATION CODES:   Not applicable  INTERVENTION:   Initiate Osmolite 1.2 @ 25 ml/hr via Cortrak tube Increase by 10 ml every 4 hours to goal rate of 55 ml/hr 30 ml Prostat BID  Provides: 1784 kcal, 103 grams protein, and 1070 ml free water.    NUTRITION DIAGNOSIS:   Increased nutrient needs related to cancer and cancer related treatments as evidenced by estimated needs.  GOAL:   Patient will meet greater than or equal to 90% of their needs  MONITOR:   TF tolerance  REASON FOR ASSESSMENT:   Consult Enteral/tube feeding initiation and management  ASSESSMENT:   Pt with PMH of COPD, HLD, R MCA stroke 2/20 and recent dx of metastatic breast cancer. Pt admitted with new L CVA with R hemiplegia and R neglect.    Palliative care following. Pt is now a DNR and making decisions. Family open to short term feedings with a cortrak.   6/2 extubated 6/3 and 6/4 failed swallow with severe aspiration risk 6/5 Cortrak placed   NUTRITION - FOCUSED PHYSICAL EXAM:  Deferred   Diet Order:   Diet Order            Diet NPO time specified  Diet effective now              EDUCATION NEEDS:   No education needs have been identified at this time  Skin:     Last BM:  unknown  Height:   Ht Readings from Last 1 Encounters:  04/23/2019 5\' 6"  (1.676 m)    Weight:   Wt Readings from Last 1 Encounters:  04/16/2019 73.9 kg    Ideal Body Weight:  59 kg  BMI:  Body mass index is 26.31 kg/m.  Estimated Nutritional Needs:   Kcal:  1700-1900  Protein:  90-110 grams  Fluid:  > 1.7 L/day  Maylon Peppers RD, LDN, CNSC [phone] Pager [phone] After Hours Pager

## 2019-03-29 NOTE — Progress Notes (Signed)
STROKE TEAM PROGRESS NOTE   INTERVAL HISTORY Pt husband at bedside.  He just finished discussion with palliative care NP Jinny Sanders.  Husband would like to take patient home once appropriate, he is in agreement with core track today and watch her swallow over the weekend.  If PEG tube needed, he is okay with that also.   Vitals:   03/29/19 0600 03/29/19 0700 03/29/19 0800 03/29/19 0832  BP: 114/68 116/81 139/80   Pulse: 80 (!) 103 96   Resp: 17 (!) 25 18   Temp:   98.8 F (37.1 C)   TempSrc:   Axillary   SpO2: 94% 95% 95% 94%  Height:        CBC:  Recent Labs  Lab 04/10/2019 0726  03/26/19 0146  03/28/19 0237 03/29/19 0224  WBC 12.9*  --  12.1*   < > 11.4* 13.1*  NEUTROABS 9.5*  --  10.3*  --   --   --   HGB 11.2*   < > 9.8*   < > 10.0* 9.7*  HCT 35.3*   < > 31.4*   < > 30.5* 30.6*  MCV 86.7  --  87.2   < > 85.0 85.5  PLT 256  --  251   < > 223 240   < > = values in this interval not displayed.    Basic Metabolic Panel:  Recent Labs  Lab 03/28/19 0237 03/29/19 0224  NA 137 140  K 3.4* 3.8  CL 104 107  CO2 19* 18*  GLUCOSE 91 81  BUN 23 29*  CREATININE 1.01* 1.00  CALCIUM 8.7* 8.7*   Lipid Panel:     Component Value Date/Time   CHOL 97 03/26/2019 0146   TRIG 98 03/26/2019 0146   HDL 39 (L) 03/26/2019 0146   CHOLHDL 2.5 03/26/2019 0146   VLDL 20 03/26/2019 0146   LDLCALC 38 03/26/2019 0146   HgbA1c:  Lab Results  Component Value Date   HGBA1C 5.7 (H) 03/26/2019   Urine Drug Screen: No results found for: LABOPIA, COCAINSCRNUR, LABBENZ, AMPHETMU, THCU, LABBARB  Alcohol Level No results found for: [facility]  IMAGING Dg Chest Port 1 View  Result Date: 03/28/2019 CLINICAL DATA:  Cough EXAM: PORTABLE CHEST 1 VIEW COMPARISON:  03/27/2019 FINDINGS: Cardiac shadow is stable. The lungs are well aerated bilaterally. Mild right basilar atelectatic changes are seen. No bony abnormality is noted. IMPRESSION: Mild right basilar atelectasis. Electronically Signed   By: Inez Catalina M.D.   On: 03/28/2019 11:22   Dg Chest Port 1 View  Result Date: 03/27/2019 CLINICAL DATA:  CVA.  Endotracheal tube removal. EXAM: PORTABLE CHEST 1 VIEW COMPARISON:  03/26/2019 FINDINGS: The endotracheal tube has been removed. Associated mild hypoaeration with low lung volumes, vascular crowding and streaky bibasilar atelectasis. No edema or definite effusions. IMPRESSION: Hypoaeration status post extubation with low lung volumes and streaky bibasilar atelectasis. Electronically Signed   By: Marijo Sanes M.D.   On: 03/27/2019 12:16   Vas [place] Lower Extremity Venous (dvt)  Result Date: 03/28/2019  Lower Venous Study Indications: Stroke.  Limitations: Patient positioning, patient immobility. Performing Technologist: Oliver Hum RVT  Examination Guidelines: A complete evaluation includes B-mode imaging, spectral Doppler, color Doppler, and power Doppler as needed of all accessible portions of each vessel. Bilateral testing is considered an integral part of a complete examination. Limited examinations for reoccurring indications may be performed as noted.  +---------+---------------+---------+-----------+----------+-------+ RIGHT    CompressibilityPhasicitySpontaneityPropertiesSummary +---------+---------------+---------+-----------+----------+-------+ CFV      Full  Yes      Yes                          +---------+---------------+---------+-----------+----------+-------+ SFJ      Full                                                 +---------+---------------+---------+-----------+----------+-------+ FV Prox  Full                                                 +---------+---------------+---------+-----------+----------+-------+ FV Mid   Full                                                 +---------+---------------+---------+-----------+----------+-------+ FV DistalFull                                                  +---------+---------------+---------+-----------+----------+-------+ PFV      Full                                                 +---------+---------------+---------+-----------+----------+-------+ POP      Full           Yes      Yes                          +---------+---------------+---------+-----------+----------+-------+ PTV      Full                                                 +---------+---------------+---------+-----------+----------+-------+ PERO     Full                                                 +---------+---------------+---------+-----------+----------+-------+   +---------+---------------+---------+-----------+----------+-------+ LEFT     CompressibilityPhasicitySpontaneityPropertiesSummary +---------+---------------+---------+-----------+----------+-------+ CFV      Full           Yes      Yes                          +---------+---------------+---------+-----------+----------+-------+ SFJ      None                                         Acute   +---------+---------------+---------+-----------+----------+-------+ FV Prox  Full                                                 +---------+---------------+---------+-----------+----------+-------+  FV Mid   Full                                                 +---------+---------------+---------+-----------+----------+-------+ FV DistalFull                                                 +---------+---------------+---------+-----------+----------+-------+ PFV      Full                                                 +---------+---------------+---------+-----------+----------+-------+ POP      Full           Yes      Yes                          +---------+---------------+---------+-----------+----------+-------+ PTV      Partial                                      Acute   +---------+---------------+---------+-----------+----------+-------+ PERO     Full                                                  +---------+---------------+---------+-----------+----------+-------+ GSV      None                                         Acute   +---------+---------------+---------+-----------+----------+-------+ SVT at [facility] in the GSV.    Summary: Right: There is no evidence of deep vein thrombosis in the lower extremity. No cystic structure found in the popliteal fossa. Left: Findings consistent with acute deep vein thrombosis involving the left posterior tibial vein. Findings consistent with acute superficial vein thrombosis involving the left great saphenous vein. No cystic structure found in the popliteal fossa.  *See table(s) above for measurements and observations. Electronically signed by Monica Martinez MD on 03/28/2019 at 5:25:02 PM.    Final    Cerebral angiogram  partial revascularization of occluded Lt MCA  Inf division with x 2 passes with 69mm x 37mm embotrap retriever device  And penumbra aspiration achieving a TICI 2a/2b revascularization  2D Echocardiogram  1. The left ventricle has normal systolic function with an ejection fraction of 60-65%. The cavity size was normal. Left ventricular diastolic Doppler parameters are consistent with pseudonormalization. No evidence of left ventricular regional wall  motion abnormalities.  2. The right ventricle has normal systolic function. The cavity was normal. There is no increase in right ventricular wall thickness.  3. The pericardial effusion is posterior to the left ventricle.  4. Trivial pericardial effusion is present.  5. There is mild to moderate mitral annular calcification present.  6. The aortic valve is tricuspid. Moderate stenosis of the aortic valve.  7. The aortic  root and ascending aorta are normal in size and structure.  8. The interatrial septum was not well visualized.   PHYSICAL EXAM  Temp:  [98.1 F (36.7 C)-99.2 F (37.3 C)] 98.8 F (37.1 C) (06/05 0800) Pulse Rate:   [72-119] 96 (06/05 0800) Resp:  [12-25] 18 (06/05 0800) BP: (109-142)/(55-109) 139/80 (06/05 0800) SpO2:  [94 %-100 %] 94 % (06/05 0832) FiO2 (%):  [97 %-100 %] 100 % (06/04 2029)  General - Well nourished, well developed, not in acute distress.  Ophthalmologic - fundi not visualized due to noncooperation.  Cardiovascular - Regular hythm, but tachycardia.  Neuro - awake alert, not in distress, eyes open, not following commands except intermittent close eyes on request, nonverbal. Eyes in left gaze position, inconsistently blinking to visual threat on the left, not blinking to visual threat on the right, not tracking on the right, PERRL. Right facial droop, tongue midline in mouth. Spontaneous raising up LUE at least 3+/5, with pain stimulation LLE 2+/5 and RLE with mild withdraw, no movement of RUE. DTR diminished and no babinski. Sensation, coordination and gait not tested.   ASSESSMENT/PLAN Ms. SAREN CORKERN is a 68 y.o. 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  Out of the TPA window.  Found to have a left M2 LVO transferred to [facility] and taken to IR.   Stroke:   L MCA infarct s/p parital IR L M2 with post revascularization hemorrhage and cerebral edema - embolic could be secondary to hypercoagulable state from metastatic breast cancer   Code Stroke CT head Acute L MCA infarct. Possible hyperdense L MCA branch. Expected evolution poster R MCA infarct since Feb.  CTA head & neck partially visible malignancy upper chest and neck w/ lymphadenopathy and 3-4 cm  L mediastinal mass. L M2 ELVO  CT perfusion 70mL core infarct w/ 15mL penumbra and hypoperfusion index 0.5  Cerebral angio L MCA M2 occlusion with TICI 2a/2b revascularization   MRI large left MCA infarct due to proximal M2 occlusion.  Intracranial hemorrhage following reperfusion consisting of both IPH of 30% of  infarcted tissue as well as SAH, pH 2 and 3C.   2D Echo EF 60-65%. No source of embolus   11/2018 TEE showed no PFO  Loop recorder placed 12/31/2008 interrogation pending   LDL 38   HgbA1c 5.7  Heparin subq for VTE prophylaxis  aspirin 81 mg daily prior to admission, now on No antithrombotic given post revascularization hemorrhage  Therapy recommendations: Pending  Disposition: Pending  Palliative care on board and husband agreed with DNR but request to take her home with home PT/OT once appropriate  Acute respiratory failure  COPD  Intubated for IR in ED   Extubated 6/2 w/ HR 100-110s afterwards  CTA neck showed partially visible malignancy upper chest and neck w/ lymphadenopathy and 3-4 cm  L mediastinal mass.  CCM on board  Received lasix once   Chest PT  3% saline nebulization  Secretion improved  Left LE DVT  LE venous doppler - acute DVT left posterior tibial vein  Likely related to advanced cancer  No AC at this time due to hemorrhagic conversion and distal DVT  Close monitoring  On heparin subq for DVT prevention  Given hypercoagulable state, may consider anticoagulation once brain hemorrhage resolves.    Primary breast cancer with metastasis  new diagnosis, for evaluation by oncology on 03/27/2019 in [place]  CTA neck showed partially visible malignancy upper chest and neck w/ lymphadenopathy and 3-4 cm  L mediastinal mass.  Baseline dyspnea likely caused by metastatic disease  Records requested from [place] 6/4 - not received yet  PCM is following  Hx stroke/TIA  05/1828 -embolic right MCA infarct with resultant short-term memory deficits and imbalance, TEE neg, loop recorder placed, put on aspirin and statin  Fever  T-max 100.6->afebrile  Leukocytosis, likely reactive 12.1->12.4->11.4->13.1  UA negative  Urine culture no growth  Blood culture no growth for 3 days  CXR 03/28/19 persistent fullness in the left hilum due to  cancer  Tachycardia, resolved  HR < 100 now  Could be related to pain at back at the metastasis site as per husband  on morphine oral PRN  Blood pressure   Home meds: None  BP stable  . BP goal < 160 given hemorrhagic conversion  Hyperlipidemia  Home meds: Zocor 40  LDL 38, goal < 70  Resume half-strength Zocor once able to swallow   Continue statin at discharge  Dysphagia  Secondary to stroke  Failed swallow  Speech following  On gentle IVF  cortrak placement requested - will start TF  Consider PEG next week if pt not able to swallow  Other Stroke Risk Factors  Advanced age  History ETOH use  Family hx stroke (daughter)  Other Active Problems  Acute blood loss anemia post IR Hgb 9.8->10->9.7  Hypokalemia 4.1->3.4->3.8  Acute urinary retention, foley inserted  Hospital day # 4  This patient is critically ill due to large left MCA stroke, hemorrhagic conversion, respiratory failure, history of stroke, metastatic breast cancer, and fever and at significant risk of neurological worsening, death form recurrent stroke, hematoma expansion, cerebral edema, brain herniation, seizure, sepsis. This patient's care requires constant monitoring of vital signs, hemodynamics, respiratory and cardiac monitoring, review of multiple databases, neurological assessment, discussion with family, other specialists and medical decision making of high complexity. I spent 40 minutes of neurocritical care time in the care of this patient. I again had long discussion with husband at bedside, updated pt current condition, treatment plan and poor prognosis.  He expressed understanding and appreciation. However, he requested to take pt home once appropriate. I also discussed with palliative care NP Jinny Sanders.     Rosalin Hawking, MD PhD Stroke Neurology 03/29/2019 9:18 AM   To contact Stroke Continuity provider, please refer to [email or web address] After hours, contact General Neurology

## 2019-03-29 NOTE — Progress Notes (Signed)
Pt arrived to unit, VSS, NAD noted, MD aware. Agree w/previous assessment as documented.

## 2019-03-29 NOTE — Progress Notes (Signed)
Report given to RN on 3W. Pt's husband has been updated of the transfer. Karlee Staff, Rande Brunt, RN

## 2019-03-29 NOTE — Progress Notes (Signed)
PT Cancellation Note  Patient Details Name: ETHIE CURLESS MRN: [id number] DOB: 1951-06-19   Cancelled Treatment:    Reason Eval/Treat Not Completed: Other (comment) (meeting with palliative).  Ellamae Sia, PT, DPT Acute Rehabilitation Services Pager [phone] Office [phone]    Willy Eddy 03/29/2019, 10:16 AM

## 2019-03-29 NOTE — Progress Notes (Signed)
Hypoglycemic Event  CBG: 68  Treatment: 8 oz juice/soda  Symptoms: None  Follow-up CBG: Time:158 CBG Result:84  Possible Reasons for Event: Inadequate meal intake  Comments/MD notified:     Rendell Thivierge, Rande Brunt

## 2019-03-29 NOTE — Procedures (Signed)
Cortrak  Person Inserting Tube:  Viva Gallaher, RD Tube Type:  Cortrak - 43 inches Tube Location:  Right nare Initial Placement:  Stomach Secured by: Bridle Technique Used to Measure Tube Placement:  Documented cm marking at nare/ corner of mouth Cortrak Secured At:  65 cm   No x-ray is required. RN may begin using tube.   If the tube becomes dislodged please keep the tube and contact the Cortrak team at [email or web address] (password [email or web address]) for replacement.  If after hours and replacement cannot be delayed, place a NG tube and confirm placement with an abdominal x-ray.   Detrell Umscheid RD, LDN Clinical Nutrition Pager # - [phone]    

## 2019-03-30 DIAGNOSIS — Z823 Family history of stroke: Secondary | ICD-10-CM

## 2019-03-30 DIAGNOSIS — Z95818 Presence of other cardiac implants and grafts: Secondary | ICD-10-CM

## 2019-03-30 DIAGNOSIS — I629 Nontraumatic intracranial hemorrhage, unspecified: Secondary | ICD-10-CM

## 2019-03-30 DIAGNOSIS — J449 Chronic obstructive pulmonary disease, unspecified: Secondary | ICD-10-CM

## 2019-03-30 DIAGNOSIS — C7989 Secondary malignant neoplasm of other specified sites: Secondary | ICD-10-CM

## 2019-03-30 DIAGNOSIS — Z171 Estrogen receptor negative status [ER-]: Secondary | ICD-10-CM

## 2019-03-30 DIAGNOSIS — C50911 Malignant neoplasm of unspecified site of right female breast: Secondary | ICD-10-CM

## 2019-03-30 LAB — CBC
HCT: 32.3 % — ABNORMAL LOW (ref 36.0–46.0)
Hemoglobin: 10.5 g/dL — ABNORMAL LOW (ref 12.0–15.0)
MCH: 27.4 pg (ref 26.0–34.0)
MCHC: 32.5 g/dL (ref 30.0–36.0)
MCV: 84.3 fL (ref 80.0–100.0)
Platelets: 279 10*3/uL (ref 150–400)
RBC: 3.83 MIL/uL — ABNORMAL LOW (ref 3.87–5.11)
RDW: 14.3 % (ref 11.5–15.5)
WBC: 14.2 10*3/uL — ABNORMAL HIGH (ref 4.0–10.5)
nRBC: 0 % (ref 0.0–0.2)

## 2019-03-30 LAB — GLUCOSE, CAPILLARY
Glucose-Capillary: 139 mg/dL — ABNORMAL HIGH (ref 70–99)
Glucose-Capillary: 132 mg/dL — ABNORMAL HIGH (ref 70–99)
Glucose-Capillary: 131 mg/dL — ABNORMAL HIGH (ref 70–99)
Glucose-Capillary: 104 mg/dL — ABNORMAL HIGH (ref 70–99)
Glucose-Capillary: 119 mg/dL — ABNORMAL HIGH (ref 70–99)

## 2019-03-30 LAB — BASIC METABOLIC PANEL
Anion gap: 12 (ref 5–15)
BUN: 24 mg/dL — ABNORMAL HIGH (ref 8–23)
CO2: 22 mmol/L (ref 22–32)
Calcium: 8.6 mg/dL — ABNORMAL LOW (ref 8.9–10.3)
Chloride: 106 mmol/L (ref 98–111)
Creatinine, Ser: 0.89 mg/dL (ref 0.44–1.00)
GFR calc Af Amer: >60 mL/min (ref >60)
GFR calc non Af Amer: >60 mL/min (ref >60)
Glucose, Bld: 169 mg/dL — ABNORMAL HIGH (ref 70–99)
Potassium: 4 mmol/L (ref 3.5–5.1)
Sodium: 140 mmol/L (ref 135–145)

## 2019-03-30 LAB — MAGNESIUM
Magnesium: 1.9 mg/dL (ref 1.7–2.4)
Magnesium: 1.9 mg/dL (ref 1.7–2.4)

## 2019-03-30 LAB — PHOSPHORUS
Phosphorus: 3.2 mg/dL (ref 2.5–4.6)
Phosphorus: 3.2 mg/dL (ref 2.5–4.6)

## 2019-03-30 MED ORDER — MORPHINE SULFATE (CONCENTRATE) 10 MG/0.5ML PO SOLN
5.0000 mg | Freq: Three times a day (TID) | ORAL | Status: DC
  Administered 2019-03-30: 5 mg via SUBLINGUAL
  Filled 2019-03-30: qty 0.5

## 2019-03-30 MED ORDER — LORAZEPAM 2 MG/ML IJ SOLN
0.2500 mg | Freq: Four times a day (QID) | INTRAMUSCULAR | Status: DC | PRN
  Administered 2019-03-31: 0.25 mg via INTRAVENOUS
  Filled 2019-03-30: qty 1

## 2019-03-30 MED ORDER — MORPHINE SULFATE (CONCENTRATE) 10 MG/0.5ML PO SOLN
5.0000 mg | Freq: Four times a day (QID) | ORAL | Status: DC
  Administered 2019-03-30 – 2019-03-31 (×2): 5 mg via SUBLINGUAL
  Filled 2019-03-30: qty 0.5

## 2019-03-30 NOTE — Evaluation (Deleted)
Clinical/Bedside Swallow Evaluation Patient Details  Name: Megan Hancock MRN: [id number] Date of Birth: 1951-05-10  Today's Date: 03/30/2019 Time: SLP Start Time (ACUTE ONLY): 1016 SLP Stop Time (ACUTE ONLY): 1030 SLP Time Calculation (min) (ACUTE ONLY): 14 min  Past Medical History:  Past Medical History:  Diagnosis Date  . COPD (chronic obstructive pulmonary disease) ([place])   . Hypercholesteremia   . IBS (irritable bowel syndrome)   . Primary breast cancer with metastasis to other site [facility])    Past Surgical History:  Past Surgical History:  Procedure Laterality Date  . IR CT HEAD LTD  04/15/2019  . IR PERCUTANEOUS ART THROMBECTOMY/INFUSION INTRACRANIAL INC DIAG ANGIO  04/08/2019  . LOOP RECORDER INSERTION N/A 01/01/2019   Procedure: LOOP RECORDER INSERTION;  Surgeon: Isaias Cowman, MD;  Location: [place] [facility];  Service: Cardiovascular;  Laterality: N/A;  . RADIOLOGY WITH ANESTHESIA N/A 04/12/2019   Procedure: IR WITH ANESTHESIA/CODE STROKE;  Surgeon: Luanne Bras, MD;  Location: [place];  Service: Radiology;  Laterality: N/A;  . TEE WITHOUT CARDIOVERSION N/A 12/21/2018   Procedure: TRANSESOPHAGEAL ECHOCARDIOGRAM (TEE);  Surgeon: Teodoro Spray, MD;  Location: [facility] OR[facility];  Service: Cardiovascular;  Laterality: N/A;   HPI:  Megan Hancock is a 68 y.o. 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 MRI Large area of acute infarction roughly corresponding to the posterior/inferior division LEFT MCA, proximal M2 occlusion, Intracranial hemorrhage following reperfusion therapy consisting of both parenchymal hematoma, as well as subarachnoid hemorrhage.   Assessment / Plan / Recommendation Clinical Impression  Pt exhibits severe oropharyngeal dysphagia characterized by poor oral control/awareness and oral  propulsion with ice chips with no swallow response initiated; delayed weak, wet cough noted after >2 min; pt combative/agitated during oral care via nursing staff and non-verbally turned head when SLP attempted continued ice chip presentation; global aphasia with receptive and expressive language appear equally impaired. Poor attention/awareness overall and pt unable to elicit volitional phonation. Pt did not respond to simple commands verbally or follow commands during oral mech exam despite max multimodal cues during BSE.  Recommend NPO continue with TF primary source of nutrition; ST will continue efforts for PO readiness as appropriate; thank you for this consult. SLP Visit Diagnosis: Dysphagia, unspecified (R13.10);Aphasia (R47.01)    Aspiration Risk  Severe aspiration risk    Diet Recommendation     Medication Administration: Via alternative means    Other  Recommendations Oral Care Recommendations: Oral care QID   Follow up Recommendations Other (comment);24 hour supervision/assistance(TBD)      Frequency and Duration min 2x/week  1 week       Prognosis Prognosis for Safe Diet Advancement: Fair Barriers to Reach Goals: Cognitive deficits;Severity of deficits      Swallow Study   General Date of Onset: 04/21/2019 HPI: Megan Hancock is a 68 y.o. 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 MRI Large area of acute infarction roughly corresponding to the posterior/inferior division LEFT MCA, proximal M2 occlusion, Intracranial hemorrhage following reperfusion therapy consisting of both parenchymal hematoma, as well as subarachnoid hemorrhage. Type of Study: Bedside Swallow Evaluation Previous Swallow Assessment: (deferred on 03/29/19; re-ordered) Diet Prior to this Study: NPO;NG Tube Temperature Spikes Noted:  No  Respiratory Status: Nasal cannula History of Recent Intubation: Yes Length of Intubations (days): 1 days Date extubated: 03/26/19 Behavior/Cognition: Distractible;Lethargic/Drowsy;Requires cueing;Doesn't follow directions Oral Cavity Assessment: Dry;Other (comment)(tongue coated) Oral Care Completed by SLP: Other (Comment)(nursing completed partially while SLP in room; pt agitated) Oral Cavity - Dentition: Missing dentition;Other (Comment) Patient Positioning: Upright in bed Baseline Vocal Quality: Not observed Volitional Cough: Weak;Congested;Wet Volitional Swallow: Unable to elicit    Oral/Motor/Sensory Function Facial ROM: Reduced right;Suspected CN VII (facial) dysfunction Facial Symmetry: Abnormal symmetry right;Suspected CN VII (facial) dysfunction   Ice Chips Ice chips: Impaired Presentation: Spoon Oral Phase Impairments: Reduced lingual movement/coordination;Poor awareness of bolus Oral Phase Functional Implications: Prolonged oral transit;Oral holding Pharyngeal Phase Impairments: Unable to trigger swallow;Cough - Delayed(cough elicited after >2 min)   Thin Liquid Thin Liquid: Not tested    Nectar Thick Nectar Thick Liquid: Not tested   Honey Thick Honey Thick Liquid: Not tested   Puree Puree: Not tested   Solid     Solid: Not tested      Elvina Sidle, M.S., CCC-SLP 03/30/2019,12:39 PM

## 2019-03-30 NOTE — Progress Notes (Signed)
Daily Progress Note   Patient Name: Megan Hancock       Date: 03/30/2019 DOB: May 11, 1951  Age: 68 y.o. MRN#: [id number] Attending Physician: Rosalin Hawking, MD Primary Care Physician: Patient, No Pcp Per Admit Date: 04/05/2019  Reason for Consultation/Follow-up: Establishing goals of care, Pain control and Psychosocial/spiritual support  Subjective: Examined patient then met with husband for approximately 1.5 hours.  Wife is visibly uncomfortable.  Shifting in bed and grimacing.  Throwing her hands up to her head.  Husband is distressed.  He is trying to calm his wife.    We went to a separate office and reviewed the Office notes and PET scan from Dr. Estill Bamberg in [place].  I explained that she has undifferentiated adenocarcinoma of unknown primary.  Per Dr. Estill Bamberg, if the patient has an ECOG status of 2 or better she could take chemo, and if she took chemo there would be a 20-30% chance of response.  Dr. Estill Bamberg recommended Hospice care, particularly now that she has had a debilitating stroke.  We discussed her current care - she has a great deal of pain from the diffuse subcutaneous nodules thru out her body.  Her husband comments that each time he rubs her back he feels new nodules.  We talked at length about hospice at home vs hospice house.  Megan Hancock is a candidate for either.  Hospice at home will allow artificial feeding thru a PEG.  Hospice House will not allow artificial feeding.    I expressed my concern that placing a PEG may be very painful for Megan Hancock and may cause another non-healing wound like the one caused by the breast biopsy.  This would require close evaluation by IR prior to placement.  Rob feels that Megan Hancock is worse today than yesterday.  If she continues to decline over the weekend he will  likely not place a PEG and choose Hospice House.  Ideally if she stabilizes he would like to place a PEG and take her home with hospice.    Rob expressed a strong need for an Materials engineer to review Megan Hancock's case and provide a second opinion.  He states -" I'm 99% of the way there, but I just can't rest without another opinion regarding her cancer."    Assessment: 9 yof with debilitating stroke, metastatic adenocarcinoma of unknown primary.  In pain.  Agitated.   Patient Profile/HPI:  68 y.o. 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 Transferred from [facility] with expressive aphasia and new L CVA with R hemiplegia and R neglect.    Patient required intubation but is currently stable off vent.  She is having some difficulty clearing her own secretions and did not pass swallow study yesterday with a moderate to severe aspiration risk.  Core Trak was placed 6/5.  Husband is attempting to make decisions regarding [place].    Length of Stay: 5  Current Medications: Scheduled Meds:    stroke: mapping our early stages of recovery book   Does not apply Once   chlorhexidine  15 mL Mouth Rinse BID   feeding supplement (PRO-STAT SUGAR FREE 64)  30 mL Per Tube BID   heparin injection (subcutaneous)  5,000 Units Subcutaneous Q8H   insulin aspart  0-9 Units Subcutaneous Q4H   mouth rinse  15 mL Mouth Rinse QID   morphine CONCENTRATE  5 mg Sublingual Q8H   pantoprazole sodium  40 mg Per Tube Daily   sodium chloride HYPERTONIC  4 mL Nebulization Q4H while awake    Continuous Infusions:  sodium chloride 25 mL/hr at 03/30/19 0800   feeding supplement (OSMOLITE 1.2 CAL) 1,000 mL (03/29/19 1253)    PRN Meds: acetaminophen **OR** acetaminophen (TYLENOL) oral liquid 160 mg/5 mL **OR** acetaminophen, morphine CONCENTRATE, senna-docusate, sodium chloride flush  Physical Exam          Well developed female, right sided weakness, aphasic, cor trak in place.  Agitated. CV rrr resp no distress Abdomen soft, tender to palpation in all quadrants Skin several raised reddened (angry looking) nodules on body, neck, forehead  Vital Signs: BP 127/72 (BP Location: Left Arm)    Pulse 93    Temp 98.7 F (37.1 C) (Axillary)    Resp 15    Ht '5\' 6"'  (1.676 m)    Wt 79.1 kg    SpO2 93%    BMI 28.15 kg/m  SpO2: SpO2: 93 % O2 Device: O2 Device: Room Air O2 Flow Rate: O2 Flow Rate (L/min): 3 L/min  Intake/output summary:   Intake/Output Summary (Last 24 hours) at 03/30/2019 1040 Last data filed at 03/30/2019 0800 Gross per 24 hour  Intake 858.33 ml  Output 1310 ml  Net -451.67 ml   LBM: Last BM Date: (PTA) Baseline Weight: Weight: 79.1 kg Most recent weight: Weight: 79.1 kg       Palliative Assessment/Data: 10%    Flowsheet Rows     Most Recent Value  Intake Tab  Referral Department  Critical care  Unit at Time of Referral  Med/Surg Unit  Palliative Care Primary Diagnosis  Cancer  Date Notified  03/27/19  Palliative Care Type  New Palliative care  Reason for referral  Clarify Goals of Care  Date of Admission  04/12/2019  Date first seen by Palliative Care  03/27/19  # of days Palliative referral response time  0 Day(s)  # of days IP prior to Palliative referral  2  Clinical Assessment  Psychosocial & Spiritual Assessment  Palliative Care Outcomes      Patient Active Problem List   Diagnosis Date Noted   Dysphagia    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 Leonie Man.  Will ask Oncology to review her case and speak with husband.  Dr. Julien Nordmann consulted and very much appreciated.  Onc records from [place] are on the hard chart at nurses station.  Husband wants to give her the weekend to see if her swallow and symptoms may improve.   If her swallow does not improve by Monday he will consider PEG.  PEG will take CAREFUL consideration prior to procedure due to numerous sub cutaneous nodules in the abdomen - which if agitated with likely form non-healing wounds.  Low dose sub lingual morphine schedule for pain and agitation.  Husband requests assistance to determine what insurance with assist him with in terms of care in his home.  Case management consult placed.  Goals of Care and Additional Recommendations:  Limitations on Scope of Treatment: Full Scope Treatment  Code Status:  DNR  Prognosis:  Weeks to months pending whether or not she has an avenue to continue nutrition outside of the hospital.  Discharge Planning:  To Be Determined   Most likely home with husband and Hospice.  Patient will eventually need Hospice House.  Care plan was discussed with Attending physician, radiology, Dr. Julien Nordmann, husband, bedside RN.  Thank you for allowing the Palliative Medicine Team to assist in the care of this patient.  Total time spent:  120 min Time in 9:00 Time out 11:00     Greater than 50%  of this time was spent counseling and coordinating care related to the above assessment and plan.  Florentina Jenny, PA-C Palliative Medicine  Please contact Palliative MedicineTeam phone at [phone] for questions and concerns between 7 am - 7 pm.   Please see AMION for individual provider pager numbers.

## 2019-03-30 NOTE — Progress Notes (Signed)
STROKE TEAM PROGRESS NOTE   INTERVAL HISTORY Patient remains neurologically stable.  She is globally aphasic with right hemiplegia.  She is being fed through a core tract tube.  No family at bedside today.  Vital signs remained stable.   Vitals:   03/29/19 2124 03/30/19 0353 03/30/19 0500 03/30/19 0819  BP:  125/70  127/72  Pulse: 90 99  93  Resp: 20 18  15   Temp:  98.9 F (37.2 C)  98.7 F (37.1 C)  TempSrc:  Oral  Axillary  SpO2: 96% 96%  95%  Weight:   79.1 kg   Height:        CBC:  Recent Labs  Lab 03/29/2019 0726  03/26/19 0146  03/29/19 0224 03/30/19 0535  WBC 12.9*  --  12.1*   < > 13.1* 14.2*  NEUTROABS 9.5*  --  10.3*  --   --   --   HGB 11.2*   < > 9.8*   < > 9.7* 10.5*  HCT 35.3*   < > 31.4*   < > 30.6* 32.3*  MCV 86.7  --  87.2   < > 85.5 84.3  PLT 256  --  251   < > 240 279   < > = values in this interval not displayed.    Basic Metabolic Panel:  Recent Labs  Lab 03/29/19 0224  03/29/19 1637 03/30/19 0535  NA 140  --   --  140  K 3.8  --   --  4.0  CL 107  --   --  106  CO2 18*  --   --  22  GLUCOSE 81  --   --  169*  BUN 29*  --   --  24*  CREATININE 1.00  --   --  0.89  CALCIUM 8.7*  --   --  8.6*  MG  --    < > 2.0 1.9  PHOS  --    < > 3.5 3.2   < > = values in this interval not displayed.   Lipid Panel:     Component Value Date/Time   CHOL 97 03/26/2019 0146   TRIG 98 03/26/2019 0146   HDL 39 (L) 03/26/2019 0146   CHOLHDL 2.5 03/26/2019 0146   VLDL 20 03/26/2019 0146   LDLCALC 38 03/26/2019 0146   HgbA1c:  Lab Results  Component Value Date   HGBA1C 5.7 (H) 03/26/2019   Urine Drug Screen: No results found for: LABOPIA, COCAINSCRNUR, LABBENZ, AMPHETMU, THCU, LABBARB  Alcohol Level No results found for: [facility]  IMAGING  Dg Chest Port 1 View 03/28/2019 IMPRESSION:  Mild right basilar atelectasis.   Cerebral angiogram  Partial revascularization of occluded Lt MCA  Inf division with x 2 passes with 38mm x 75mm embotrap retriever  device  And penumbra aspiration achieving a TICI 2a/2b revascularization  2D Echocardiogram  1. The left ventricle has normal systolic function with an ejection fraction of 60-65%. The cavity size was normal. Left ventricular diastolic Doppler parameters are consistent with pseudonormalization. No evidence of left ventricular regional wall  motion abnormalities.  2. The right ventricle has normal systolic function. The cavity was normal. There is no increase in right ventricular wall thickness.  3. The pericardial effusion is posterior to the left ventricle.  4. Trivial pericardial effusion is present.  5. There is mild to moderate mitral annular calcification present.  6. The aortic valve is tricuspid. Moderate stenosis of the aortic valve.  7. The aortic  root and ascending aorta are normal in size and structure.  8. The interatrial septum was not well visualized.   PHYSICAL EXAM  Temp:  [98.1 F (36.7 C)-98.9 F (37.2 C)] 98.7 F (37.1 C) (06/06 0819) Pulse Rate:  [77-106] 93 (06/06 0819) Resp:  [14-27] 15 (06/06 0819) BP: (111-158)/(57-97) 127/72 (06/06 0819) SpO2:  [90 %-97 %] 95 % (06/06 0819) Weight:  [79.1 kg] 79.1 kg (06/06 0500)  General -middle-aged Caucasian lady not in acute distress.  She has a core track feeding tube  Ophthalmologic - fundi not visualized due to noncooperation.  Cardiovascular - Regular hythm, but tachycardia.  Neuro - awake alert, not in distress, eyes open, globally aphasic not following commands except intermittent close eyes on request, nonverbal. Eyes in left gaze position, inconsistently blinking to visual threat on the left, not blinking to visual threat on the right, not tracking on the right, PERRL. Right facial droop, tongue midline in mouth. Spontaneous raising up LUE at least 3+/5, with pain stimulation LLE 2+/5 and RLE with mild withdraw, no movement of RUE. DTR diminished and no babinski. Sensation, coordination and gait not  tested.   ASSESSMENT/PLAN Ms. ZONIA CAPLIN is a 68 y.o. female with history of COPD, HLD, R MCA stroke in February 2020, recent diagnosis of metastatic breast cancer presenting to [facility] with expressive aphasia.  Out of the TPA window.  Found to have a left M2 LVO transferred to [facility] and taken to IR.   Stroke:   L MCA infarct s/p parital IR L M2 with post revascularization hemorrhage and cerebral edema - embolic could be secondary to hypercoagulable state from metastatic breast cancer   Code Stroke CT head Acute L MCA infarct. Possible hyperdense L MCA branch. Expected evolution poster R MCA infarct since Feb.  CTA head & neck partially visible malignancy upper chest and neck w/ lymphadenopathy and 3-4 cm  L mediastinal mass. L M2 ELVO  CT perfusion 65mL core infarct w/ 76mL penumbra and hypoperfusion index 0.5  Cerebral angio L MCA M2 occlusion with TICI 2a/2b revascularization   MRI - large left MCA infarct due to proximal M2 occlusion.  Intracranial hemorrhage following reperfusion consisting of both IPH of 30% of infarcted tissue as well as SAH, pH 2 and 3C.   2D Echo EF 60-65%. No source of embolus   TEE 11/2018 showed no PFO  Loop recorder - placed 12/31/2008 interrogation pending   LDL 38   HgbA1c 5.7  Heparin subq for VTE prophylaxis  aspirin 81 mg daily prior to admission, now on No antithrombotic given post revascularization hemorrhage  Therapy recommendations: Pending  Disposition: Pending  Palliative care on board and husband agreed with DNR but request to take her home with home PT/OT once appropriate  Acute respiratory failure  COPD  Intubated for IR in ED   Extubated 6/2 w/ HR 100-110s afterwards  CTA neck showed partially visible malignancy upper chest and neck w/ lymphadenopathy and 3-4 cm  L mediastinal mass.  CCM on board  Received lasix once   Chest PT  3% saline nebulization  Secretion improved  Left LE DVT  LE venous doppler -  acute DVT left posterior tibial vein  Likely related to advanced cancer  No AC at this time due to hemorrhagic conversion and distal DVT  Close monitoring  On heparin subq for DVT prevention  Given hypercoagulable state, may consider anticoagulation once brain hemorrhage resolves.    Primary breast cancer with metastasis  New diagnosis,  for evaluation by oncology on 03/27/2019 in [place]  CTA neck showed partially visible malignancy upper chest and neck w/ lymphadenopathy and 3-4 cm  L mediastinal mass.  Baseline dyspnea likely caused by metastatic disease  Records requested from [place] 6/4 - not received yet  PCM is following  Hx stroke/TIA  02/4626 -embolic right MCA infarct with resultant short-term memory deficits and imbalance, TEE neg, loop recorder placed, put on aspirin and statin  Fever  T-max 100.6->afebrile  Leukocytosis, likely reactive 12.1->12.4->11.4->13.1->14.2 (afebrile)  UA negative  Urine culture no growth  Blood culture no growth for 3 days  CXR 03/28/19 persistent fullness in the left hilum due to cancer  Tachycardia, resolved  HR < 100 now  Could be related to pain at back at the metastasis site as per husband  On morphine oral PRN  Blood pressure   Home meds: None  BP stable  . BP goal < 160 given hemorrhagic conversion  Hyperlipidemia  Home meds: Zocor 40  LDL 38, goal < 70  Resume half-strength Zocor once able to swallow   Continue statin at discharge  Dysphagia  Secondary to stroke  Failed swallow  Speech following  On gentle IVF  Cortrak placed 03/29/19 - will start TF  Consider PEG next week if pt not able to swallow   Hyperglycemia - 169 - may need to change supplemental feeding solution (osmolite with additional sugar free supplement) - monitor (SSI already ordered)  Other Stroke Risk Factors  Advanced age  History ETOH use  Family hx stroke (daughter)  Other Active Problems  Acute blood loss anemia  post IR Hgb 9.8->10->9.7->10.5  Hypokalemia 4.1->3.4->3.8->4.0  Acute urinary retention, foley inserted  Hospital day # 5  The patient has unfortunately a large left MCA infarct despite mechanical thrombectomy and has significant global aphasia and dense right hemiplegia and prognosis is poor.  She is also has a recent diagnosis of very aggressive breast cancer from which treatment has not yet been initiated but unfortunately may not be possible anymore due to the disabling stroke.  I again had long discussion with husband at bedside, updated pt current condition, treatment plan and poor prognosis.  He expressed understanding and appreciation. However, he requested to take pt home once appropriate.  We will get oncology consult and see if they and patient want further cancer related testing or just transition to comfort care.. I also discussed with palliative care NP Jinny Sanders.  Greater than 50% time during this 35-minute visit was spent on counseling and coordination of care about her embolic stroke, recent diagnosis of cancer and discussion about prognosis and plan of care and answering questions.  Antony Contras, MD  To contact Stroke Continuity provider, please refer to [email or web address] After hours, contact General Neurology

## 2019-03-30 NOTE — Consult Note (Signed)
[place] [phone]   [phone]  CONSULT NOTE  REFERRING PHYSICIAN: Florentina Jenny, PA-c  REASON FOR CONSULTATION:  68 years old white female with metastatic breast cancer.  HPI [name] is a 68 y.o. 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  The patient was unable to verbally communicate her history and all the history is taken from the chart records and also discussion with her husband.  The patient had a stroke in February 2020.  She was found to have a palpable lump in the right breast in January 2020.  Ultrasound of the right breast on November 23, 2018 showed soft tissue swelling and edema superficially at the 4 o'clock position in the right breast corresponding to the site of the patient palpable lump.  Her work-up was delayed because of the large parietal embolic stroke on December 20, 2018.  The patient returned back to [facility] in March 2020 and a right breast mass biopsy was performed on 02/06/2019 and the pathology was consistent with invasive poorly differentiated carcinoma consistent with breast primary based on the consultation with [facility] pathology department.  The tumor was ER, PR and HER-2 neu negative.  A PET scan on 02/09/2019 showed extensive subcutaneous nodules and diffuse lymphadenopathy throughout the body with the largest lesion in the medial right breast as well as left hilar region.  The patient was readmitted to [facility] recently with hemorrhagic stroke.  She is unable to communicate and laying in bed with some agitation.  Her husband was at the bedside.  HPI  Past Medical History:  Diagnosis Date   COPD (chronic obstructive pulmonary disease) (HCC)    Hypercholesteremia    IBS (irritable bowel syndrome)    Primary breast cancer with  metastasis to other site [facility])     Past Surgical History:  Procedure Laterality Date   IR CT HEAD LTD  04/12/2019   IR PERCUTANEOUS ART THROMBECTOMY/INFUSION INTRACRANIAL INC DIAG ANGIO  04/19/2019   LOOP RECORDER INSERTION N/A 01/01/2019   Procedure: LOOP RECORDER INSERTION;  Surgeon: Isaias Cowman, MD;  Location: [place] [facility];  Service: Cardiovascular;  Laterality: N/A;   RADIOLOGY WITH ANESTHESIA N/A 03/26/2019   Procedure: IR WITH ANESTHESIA/CODE STROKE;  Surgeon: Luanne Bras, MD;  Location: [place];  Service: Radiology;  Laterality: N/A;   TEE WITHOUT CARDIOVERSION N/A 12/21/2018   Procedure: TRANSESOPHAGEAL ECHOCARDIOGRAM (TEE);  Surgeon: Teodoro Spray, MD;  Location: [facility] ORS;  Service: Cardiovascular;  Laterality: N/A;    Family History  Problem Relation Age of Onset   Stroke Daughter     Social History Social History   Tobacco Use   Smoking status: Never Smoker   Smokeless tobacco: Never Used  Substance Use Topics   Alcohol use: Not Currently   Drug use: Not on file    No Known Allergies  Current Facility-Administered Medications  Medication Dose Route Frequency Provider Last Rate Last Dose    stroke: mapping our early stages of recovery book   Does not apply Once Aroor, Karena Addison R, MD       0.9 %  sodium chloride infusion   Intravenous Continuous Rosalin Hawking, MD 25 mL/hr at 03/30/19 0800     acetaminophen (TYLENOL) tablet 650 mg  650 mg Oral Q4H PRN Luanne Bras, MD       Or   acetaminophen (TYLENOL)  solution 650 mg  650 mg Per Tube Q4H PRN Luanne Bras, MD       Or   acetaminophen (TYLENOL) suppository 650 mg  650 mg Rectal Q4H PRN Luanne Bras, MD   650 mg at 03/27/19 1700   chlorhexidine (PERIDEX) 0.12 % solution 15 mL  15 mL Mouth Rinse BID Rosalin Hawking, MD   15 mL at 03/30/19 1054   feeding supplement (OSMOLITE 1.2 CAL) liquid 1,000 mL  1,000 mL Per Tube Continuous Rosalin Hawking, MD 10 mL/hr at 03/29/19 1253 1,000  mL at 03/29/19 1253   feeding supplement (PRO-STAT SUGAR FREE 64) liquid 30 mL  30 mL Per Tube BID Rosalin Hawking, MD   30 mL at 03/30/19 1054   heparin injection 5,000 Units  5,000 Units Subcutaneous Q8H Rosalin Hawking, MD   5,000 Units at 03/30/19 0541   insulin aspart (novoLOG) injection 0-9 Units  0-9 Units Subcutaneous Q4H Rosalin Hawking, MD   1 Units at 03/30/19 7106   MEDLINE mouth rinse  15 mL Mouth Rinse QID Amie Portland, MD   15 mL at 03/30/19 0400   morphine CONCENTRATE 10 MG/0.5ML oral solution 5 mg  5 mg Oral Q2H PRN Dellinger, Bobby Rumpf, PA-C   5 mg at 03/30/19 0301   morphine CONCENTRATE 10 MG/0.5ML oral solution 5 mg  5 mg Sublingual Q8H Dellinger, Marianne L, PA-C   5 mg at 03/30/19 1054   pantoprazole sodium (PROTONIX) 40 mg/20 mL oral suspension 40 mg  40 mg Per Tube Daily Rosalin Hawking, MD   40 mg at 03/30/19 1055   senna-docusate (Senokot-S) tablet 1 tablet  1 tablet Oral QHS PRN Aroor, Lanice Schwab, MD       sodium chloride flush (NS) 0.9 % injection 10-40 mL  10-40 mL Intracatheter PRN Rosalin Hawking, MD       sodium chloride HYPERTONIC 3 % nebulizer solution 4 mL  4 mL Nebulization Q4H while awake Rosalin Hawking, MD   4 mL at 03/30/19 2694    Review of Systems  Review of systems not obtained due to patient factors.  Physical Exam  WNI:[id number], ill looking, restless and uncooperative SKIN: positive for: Multiple subcutaneous masses. HEAD: Subcutaneous nodules on the right frontal area EYES: sclera clear EARS: External ears normal OROPHARYNX:no exudate and no erythema  NECK: no adenopathy LYMPH:  not examined BREAST:not examined LUNGS: clear to auscultation , and palpation HEART: regular rate & rhythm, no murmurs and no gallops ABDOMEN:abdomen soft, non-tender and normal bowel sounds BACK: Back symmetric, no curvature. EXTREMITIES:no joint deformities, effusion, or inflammation  NEURO: Uncooperative.  PERFORMANCE STATUS: ECOG 3  LABORATORY DATA: Lab Results    Component Value Date   WBC 14.2 (H) 03/30/2019   HGB 10.5 (L) 03/30/2019   HCT 32.3 (L) 03/30/2019   MCV 84.3 03/30/2019   PLT 279 03/30/2019    _0 @  RADIOGRAPHIC STUDIES: Ct Angio Head W Or Wo Contrast  Result Date: 03/29/2019 CLINICAL DATA:  68 year old female code stroke with evidence of acute left MCA infarct on plain head CT today. Right MCA territory infarct in February. EXAM: CT ANGIOGRAPHY HEAD AND NECK CT PERFUSION BRAIN TECHNIQUE: Multidetector CT imaging of the head and neck was performed using the standard protocol during bolus administration of intravenous contrast. Multiplanar CT image reconstructions and MIPs were obtained to evaluate the vascular anatomy. Carotid stenosis measurements (when applicable) are obtained utilizing NASCET criteria, using the distal internal carotid diameter as the denominator. Multiphase CT imaging of the brain was performed  following IV bolus contrast injection. Subsequent parametric perfusion maps were calculated using RAPID software. CONTRAST:  182m ISOVUE-370 IOPAMIDOL (ISOVUE-370) INJECTION 76% COMPARISON:  Plain head CT 0734 hours today. Brain MRI and intracranial MRA 12/20/2018. FINDINGS: CT Brain Perfusion Findings: ASPECTS: 8 CBF (<30%) Volume: 372mPerfusion (Tmax>6.0s) volume: 6289mhypoperfusion index 0.5 Mismatch Volume: 68m39mfarction Location:Left MCA, primarily posterior division. CTA NECK Skeleton: No acute osseous abnormality identified. Upper chest: Partially visible left mediastinal mass on series 4, image 1. this measures at least 3-4 centimeters. Centrilobular emphysema. Other neck: Abnormal lymph nodes at the thoracic inlet, level 4 individually up to 14 millimeters short axis. The thyroid is also abnormal and appears inseparable from the lower strap muscles on series 4, image 30. There are additional abnormal cervical lymph nodes including right level 3 nodes up to 13 millimeter short axis and subcutaneous soft tissue nodules  up to 17 millimeters (series 4, image 60 and 69. No laryngeal or pharyngeal mass. Aortic arch: Abutted by tumor. 3 vessel arch configuration. Great vessel origins are patent with no stenosis. Right carotid system: Minimal atherosclerosis at the right carotid bifurcation, no stenosis. Left carotid system: Mild soft and calcified plaque at the left ICA origin with no stenosis. Vertebral arteries: Mild-to-moderate right vertebral artery origin stenosis due to partially calcified plaque. The right vertebral artery is tortuous but patent to the skull base without additional stenosis. No proximal left subclavian artery or left vertebral artery origin stenosis. Patent left vertebral artery to the skull base without stenosis. CTA HEAD Posterior circulation: No distal vertebral stenosis. Patent PICA origins, basilar artery, AICA, SCA and PCA origins. Posterior communicating arteries are diminutive or absent. Bilateral PCA branches are within normal limits. Anterior circulation: Both ICA siphons are patent. On the left there is mild calcified plaque without stenosis. On the right there is mild calcified plaque without stenosis. The carotid termini, MCA and ACA origins are patent and normal. Anterior communicating artery and bilateral ACA branches are within normal limits. Left MCA M1 is patent. The left MCA bifurcation is patent. The dominant posterior left M2 branch is occluded just before its bifurcation about 6 or 7 millimeters from the left MCA bifurcation. See series 10, image 136. The left MCA anterior division appears stable compared to MRA earlier this year. Right MCA M1 and bifurcation are patent without stenosis. Right MCA branches appear patent although there is a moderate to severe stenosis of a distal M2 or M3 branch on series 11, image 13. Venous sinuses: Early contrast timing, not well evaluated. Anatomic variants: Dominant right vertebral artery. Review of the MIP images confirms the above findings IMPRESSION:  1. 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 2. Positive for Left MCA M2 emergent large vessel occlusion with estimated core infarct of 31 mL and penumbra 62 mL with hypoperfusion index of 0.5. 3. The above was discussed by telephone with Dr. IssaMyrene Buddythe ED on 04/21/2019 at 0833 hours. 4. No carotid artery stenosis. Mild to moderate right vertebral artery origin stenosis, otherwise negative posterior circulation. There is a right MCA M3 stenosis in the posterior division. Electronically Signed   By: H  HGenevie Ann.   On: 03/26/2019 08:37   Ct Angio Neck W Or Wo Contrast  Result Date: 04/06/2019 CLINICAL DATA:  67 y33r old female code stroke with evidence of acute left MCA infarct on plain head CT today. Right MCA territory infarct in February. EXAM: CT  ANGIOGRAPHY HEAD AND NECK CT PERFUSION BRAIN TECHNIQUE: Multidetector CT imaging of the head and neck was performed using the standard protocol during bolus administration of intravenous contrast. Multiplanar CT image reconstructions and MIPs were obtained to evaluate the vascular anatomy. Carotid stenosis measurements (when applicable) are obtained utilizing NASCET criteria, using the distal internal carotid diameter as the denominator. Multiphase CT imaging of the brain was performed following IV bolus contrast injection. Subsequent parametric perfusion maps were calculated using RAPID software. CONTRAST:  176m ISOVUE-370 IOPAMIDOL (ISOVUE-370) INJECTION 76% COMPARISON:  Plain head CT 0734 hours today. Brain MRI and intracranial MRA 12/20/2018. FINDINGS: CT Brain Perfusion Findings: ASPECTS: 8 CBF (<30%) Volume: 370mPerfusion (Tmax>6.0s) volume: 6265mhypoperfusion index 0.5 Mismatch Volume: 16m11mfarction Location:Left MCA, primarily posterior division. CTA NECK Skeleton: No acute osseous abnormality identified. Upper chest: Partially visible left mediastinal mass on  series 4, image 1. this measures at least 3-4 centimeters. Centrilobular emphysema. Other neck: Abnormal lymph nodes at the thoracic inlet, level 4 individually up to 14 millimeters short axis. The thyroid is also abnormal and appears inseparable from the lower strap muscles on series 4, image 30. There are additional abnormal cervical lymph nodes including right level 3 nodes up to 13 millimeter short axis and subcutaneous soft tissue nodules up to 17 millimeters (series 4, image 60 and 69. No laryngeal or pharyngeal mass. Aortic arch: Abutted by tumor. 3 vessel arch configuration. Great vessel origins are patent with no stenosis. Right carotid system: Minimal atherosclerosis at the right carotid bifurcation, no stenosis. Left carotid system: Mild soft and calcified plaque at the left ICA origin with no stenosis. Vertebral arteries: Mild-to-moderate right vertebral artery origin stenosis due to partially calcified plaque. The right vertebral artery is tortuous but patent to the skull base without additional stenosis. No proximal left subclavian artery or left vertebral artery origin stenosis. Patent left vertebral artery to the skull base without stenosis. CTA HEAD Posterior circulation: No distal vertebral stenosis. Patent PICA origins, basilar artery, AICA, SCA and PCA origins. Posterior communicating arteries are diminutive or absent. Bilateral PCA branches are within normal limits. Anterior circulation: Both ICA siphons are patent. On the left there is mild calcified plaque without stenosis. On the right there is mild calcified plaque without stenosis. The carotid termini, MCA and ACA origins are patent and normal. Anterior communicating artery and bilateral ACA branches are within normal limits. Left MCA M1 is patent. The left MCA bifurcation is patent. The dominant posterior left M2 branch is occluded just before its bifurcation about 6 or 7 millimeters from the left MCA bifurcation. See series 10, image 136.  The left MCA anterior division appears stable compared to MRA earlier this year. Right MCA M1 and bifurcation are patent without stenosis. Right MCA branches appear patent although there is a moderate to severe stenosis of a distal M2 or M3 branch on series 11, image 13. Venous sinuses: Early contrast timing, not well evaluated. Anatomic variants: Dominant right vertebral artery. Review of the MIP images confirms the above findings IMPRESSION: 1. 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 2. Positive for Left MCA M2 emergent large vessel occlusion with estimated core infarct of 31 mL and penumbra 62 mL with hypoperfusion index of 0.5. 3. The above was discussed by telephone with Dr. IssaMyrene Buddythe ED on 04/12/2019 at 0833 hours. 4. No carotid artery stenosis. Mild to moderate right vertebral artery origin stenosis, otherwise  negative posterior circulation. There is a right MCA M3 stenosis in the posterior division. Electronically Signed   By: Genevie Ann M.D.   On: 04/23/2019 08:37   Mr Brain Wo Contrast  Result Date: 03/26/2019 CLINICAL DATA:  Status post CVA with thrombectomy. RIGHT-sided weakness. EXAM: MRI HEAD WITHOUT CONTRAST TECHNIQUE: Multiplanar, multiecho pulse sequences of the brain and surrounding structures were obtained without intravenous contrast. COMPARISON:  CTA and CTP 03/30/2019. Catheter angiogram 04/06/2019 with postprocedure CT. MR brain and MRA 12/20/2018 FINDINGS: Brain: Large area of restricted diffusion, corresponding low ADC, largely corresponding to the posterior inferior division of the LEFT MCA. The area of infarction roughly corresponds to the penumbra observed on prior CTP, slightly greater. There is involvement of the insula, temporal lobe, frontal lobe, parietal lobe, and occipital lobe, as well as periventricular white matter Ovoid parenchymal hemorrhage, epicenter LEFT insula and external  capsule, approximate measurements 27 x 44 x 25 mm, corresponding volume 15 mL. Subarachnoid hemorrhage within the LEFT sylvian fissure and over the LEFT cerebral convexity is also noted, and corresponds with the postprocedure CT. Mild mass effect LEFT-to-RIGHT at the septum pellucidum level, 3 mm. Chronic RIGHT parietal infarction with encephalomalacia, was acute in February 2020. Vascular: Proximal flow voids are maintained, including LEFT M1 MCA. Skull and upper cervical spine: Unremarkable calvarium and skull base. Sebaceous cyst RIGHT frontal scalp. Sinuses/Orbits: Negative sinuses.  BILATERAL cataract extraction. Other: None. IMPRESSION: Large area of acute infarction roughly corresponding to the posterior/inferior division LEFT MCA, proximal M2 occlusion. Intracranial hemorrhage following reperfusion therapy consisting of both parenchymal hematoma occupying roughly 30% of the infarcted tissue with mass effect, as well as subarachnoid hemorrhage. Heidelburg classification PH2 and 3C. 3 mm LEFT-to-RIGHT mass effect at septum pellucidum. Continued surveillance is warranted. Electronically Signed   By: Staci Righter M.D.   On: 03/26/2019 13:31   [facility]  Result Date: 03/27/2019 INDICATION: Aphasia, right-sided weakness. Occluded dominant inferior division of the left middle cerebral artery on CT angiogram of the head and neck. EXAM: 1. EMERGENT LARGE VESSEL OCCLUSION THROMBOLYSIS (anterior CIRCULATION) COMPARISON:  CT angiogram of the head and neck of 04/17/2019. MEDICATIONS: Ancef 2 g IV antibiotic was administered within 1 hour of the procedure. ANESTHESIA/SEDATION: General anesthesia. CONTRAST:  Isovue 300 approximately 60 mL. FLUOROSCOPY TIME:  Fluoroscopy Time: 47 minutes 0 seconds (1483 mGy). COMPLICATIONS: None immediate. TECHNIQUE: Following a full explanation of the procedure along with the potential associated complications, an informed witnessed consent was obtained from the patient's husband.  The risks of intracranial hemorrhage of 10%, worsening neurological deficit, ventilator dependency, death and inability to revascularize were all reviewed in detail with the patient's husband. The patient was then put under general anesthesia by the Department of Anesthesiology at [facility]. The right groin was prepped and draped in the usual sterile fashion. Thereafter using modified Seldinger technique, transfemoral access into the right common femoral artery was obtained without difficulty. Over a 0.035 inch guidewire a 5 French Pinnacle sheath was inserted. Through this, and also over a 0.035 inch guidewire a 5 [place] JB 1 catheter was advanced to the aortic arch region and selectively positioned in the left common carotid artery. FINDINGS: The left common carotid arteriogram demonstrates the left external carotid artery and its major branches to be widely patent. The left internal carotid artery at the bulb to the cranial skull base demonstrates wide patency. The petrous, cavernous segments are also widely patent. The left middle cerebral artery demonstrates complete angiographic occlusion of  the left middle cerebral artery inferior division mid M2 region. The left anterior cerebral artery opacifies into the capillary and venous phases. Cross-filling via the anterior communicating artery of the right anterior cerebral artery and distally is noted. PROCEDURE: The diagnostic JB 1 catheter in the left common carotid artery was then exchanged over a 0.035 inch 300 cm Rosen exchange guidewire for an 8 [place] short Pinnacle sheath. Good aspiration was obtained from the hub of the 8 [place] Pinnacle sheath. This was then connected to continuous heparinized saline infusion. Over the exchange guidewire, an 80 cm 8 [place] Neuron Max guide sheath was then advanced to the mid left internal carotid cervical segment. The guidewire was removed. Good aspiration obtained from the hub of the Neuron Max sheath. A gentle  control arteriogram performed through this demonstrates no evidence of spasms, dissections or of intraluminal filling defects. In a coaxial manner and with constant heparinized saline infusion using biplane roadmap technique and constant fluoroscopic guidance, a combination of a 6 [place] Catalyst 132 cm guide catheter inside of which was a Velocity 160 027 microcatheter was advanced over a 016 inch Fathom micro guidewire to the distal end of the Neuron Max sheath. With the micro guidewire leading with a J-tip configuration to avoid dissections and cause vasospasm, combination was navigated to the supraclinoid left ICA. Using a torque device, access into the M2 M3 region of the inferior division was obtained with the micro guidewire followed by the microcatheter. The guidewire was removed. Slow aspiration of blood was noted from the hub of the microcatheter. A gentle contrast injection demonstrated antegrade flow into the distal M3 M4 regions of the inferior division. A 5 mm x 33 mm Embotrap retrieval device was then advanced to the distal end of the microcatheter. The O ring on the delivery microcatheter was then loosened. With slight gentle forward traction with the right hand on the delivery micro guidewire, with the left hand the delivery microcatheter was retrieved unsheathing the distal and then the proximal portion of the retrieval device. The 6 [place] Catalyst guide catheter was now advanced into the distal M1 segment just inside of the origin of the inferior division. Thereafter, with constant aspiration being applied with the Penumbra aspiration device at the hub of the Catalyst guide catheter, and also with a 60 mL syringe at the hub of the Neuron Max sheath, the combination of the retrieval device, the microcatheter and the 6 [place] Catalyst guide catheter was then retrieved and removed. Aspiration was continued at the hub of the Neuron Max sheath. There were a few specks of clot material noted in the  interstices of the retrieval device. However, a control arteriogram performed through the Neuron Max sheath demonstrated no significant change in the inferior division of the left middle cerebral artery. A second pass was then made again using the above combination again after having accessed the M2 M3 region of the inferior division with the microcatheter over a micro guidewire. Again after having established safe position of the tip of the microcatheter, the device was then deployed. Again with the Catalyst guide catheter now advanced just inside the inferior division origin to engage the clot, constant aspiration with a Penumbra aspiration device was then deployed for approximately 2 minutes following the deployment of the retrieval device. Thereafter, with constant aspiration with a 60 mL syringe the hub of the Neuron Max sheath, the combination of the retrieval device, the 6 [place] Catalyst guide catheter and the microcatheter were retrieved and removed with  the Penumbra aspiration being continued at the hub of the Catalyst guide catheter. No clot was seen in the aspirate. A control arteriogram performed through the Neuron Max sheath in the distal left internal carotid artery demonstrated only marginal improvement in the revascularization of the inferior division into the M3 region. A third attempt was then made again using the above combination except for the microcatheter being an 021 Trevo ProVue microcatheter which was advanced into the M2 M3 region of the inferior division of the left middle cerebral artery. However, there was significant resistance to the aspiration of blood at the hub of the microcatheter. The Microcatheter tip was then gently withdrawn more proximally until there was mild aspiration. Very gentle contrast injection demonstrated opacification with stagnation of contrast distal to the microcatheter tip. Although there was a hint of contrast noted which remained static without clearance  suspicious of subarachnoid extension of the contrast material. The procedure was therefore stopped. The microcatheter was retrieved and removed. A final control arteriogram performed through the 8 French Neuron Max achieved in the left internal carotid artery continued to demonstrate modestly improved caliber and flow through the left MCA inferior division with a near revascularization. The 8 [place] Neuron Max sheath was then retrieved and removed. The 8 French Pinnacle sheath was then removed with the successful application of a 7 [place] ExoSeal closure device for hemostasis. The right groin appeared soft without evidence of hematoma or bleeding. Distal pulses remained Dopplerable in the posterior tibial, and the dorsalis pedis branches in both feet unchanged. A flat panel CT of the brain demonstrated no evidence of mass effect or midline shift. However, there continued to be early changes of vasogenic edema in the left parietal region the core area of the brain seen on CT perfusion maps. Also there is now the presence of contrast +/-blood breakdown products overlying the left parietal convexity and also to a less degree the left facial area. During the procedure, the patient's blood pressure, and hemodynamic status remained stable. The patient was then transported to the neuro ICU to continue with post thrombectomy management. IMPRESSION: Status post endovascular revascularization of occluded left middle cerebral artery dominant inferior division with TICI 2b revascularization. PLAN: Follow-up as per referring MD. Electronically Signed   By: Luanne Bras M.D.   On: 03/26/2019 14:18   Ct Cerebral Perfusion W Contrast  Result Date: 04/03/2019 CLINICAL DATA:  68 year old female code stroke with evidence of acute left MCA infarct on plain head CT today. Right MCA territory infarct in February. EXAM: CT ANGIOGRAPHY HEAD AND NECK CT PERFUSION BRAIN TECHNIQUE: Multidetector CT imaging of the head and neck was  performed using the standard protocol during bolus administration of intravenous contrast. Multiplanar CT image reconstructions and MIPs were obtained to evaluate the vascular anatomy. Carotid stenosis measurements (when applicable) are obtained utilizing NASCET criteria, using the distal internal carotid diameter as the denominator. Multiphase CT imaging of the brain was performed following IV bolus contrast injection. Subsequent parametric perfusion maps were calculated using RAPID software. CONTRAST:  125m ISOVUE-370 IOPAMIDOL (ISOVUE-370) INJECTION 76% COMPARISON:  Plain head CT 0734 hours today. Brain MRI and intracranial MRA 12/20/2018. FINDINGS: CT Brain Perfusion Findings: ASPECTS: 8 CBF (<30%) Volume: 380mPerfusion (Tmax>6.0s) volume: 6268mhypoperfusion index 0.5 Mismatch Volume: 29m85mfarction Location:Left MCA, primarily posterior division. CTA NECK Skeleton: No acute osseous abnormality identified. Upper chest: Partially visible left mediastinal mass on series 4, image 1. this measures at least 3-4 centimeters. Centrilobular emphysema. Other neck: Abnormal lymph nodes  at the thoracic inlet, level 4 individually up to 14 millimeters short axis. The thyroid is also abnormal and appears inseparable from the lower strap muscles on series 4, image 30. There are additional abnormal cervical lymph nodes including right level 3 nodes up to 13 millimeter short axis and subcutaneous soft tissue nodules up to 17 millimeters (series 4, image 60 and 69. No laryngeal or pharyngeal mass. Aortic arch: Abutted by tumor. 3 vessel arch configuration. Great vessel origins are patent with no stenosis. Right carotid system: Minimal atherosclerosis at the right carotid bifurcation, no stenosis. Left carotid system: Mild soft and calcified plaque at the left ICA origin with no stenosis. Vertebral arteries: Mild-to-moderate right vertebral artery origin stenosis due to partially calcified plaque. The right vertebral artery  is tortuous but patent to the skull base without additional stenosis. No proximal left subclavian artery or left vertebral artery origin stenosis. Patent left vertebral artery to the skull base without stenosis. CTA HEAD Posterior circulation: No distal vertebral stenosis. Patent PICA origins, basilar artery, AICA, SCA and PCA origins. Posterior communicating arteries are diminutive or absent. Bilateral PCA branches are within normal limits. Anterior circulation: Both ICA siphons are patent. On the left there is mild calcified plaque without stenosis. On the right there is mild calcified plaque without stenosis. The carotid termini, MCA and ACA origins are patent and normal. Anterior communicating artery and bilateral ACA branches are within normal limits. Left MCA M1 is patent. The left MCA bifurcation is patent. The dominant posterior left M2 branch is occluded just before its bifurcation about 6 or 7 millimeters from the left MCA bifurcation. See series 10, image 136. The left MCA anterior division appears stable compared to MRA earlier this year. Right MCA M1 and bifurcation are patent without stenosis. Right MCA branches appear patent although there is a moderate to severe stenosis of a distal M2 or M3 branch on series 11, image 13. Venous sinuses: Early contrast timing, not well evaluated. Anatomic variants: Dominant right vertebral artery. Review of the MIP images confirms the above findings IMPRESSION: 1. 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 2. Positive for Left MCA M2 emergent large vessel occlusion with estimated core infarct of 31 mL and penumbra 62 mL with hypoperfusion index of 0.5. 3. The above was discussed by telephone with Dr. Myrene Buddy in the ED on 04/15/2019 at 0833 hours. 4. No carotid artery stenosis. Mild to moderate right vertebral artery origin stenosis, otherwise negative posterior circulation.  There is a right MCA M3 stenosis in the posterior division. Electronically Signed   By: Genevie Ann M.D.   On: 03/30/2019 08:37   Dg Chest Port 1 View  Result Date: 03/28/2019 CLINICAL DATA:  Cough EXAM: PORTABLE CHEST 1 VIEW COMPARISON:  03/27/2019 FINDINGS: Cardiac shadow is stable. The lungs are well aerated bilaterally. Mild right basilar atelectatic changes are seen. No bony abnormality is noted. IMPRESSION: Mild right basilar atelectasis. Electronically Signed   By: Inez Catalina M.D.   On: 03/28/2019 11:22   Dg Chest Port 1 View  Result Date: 03/27/2019 CLINICAL DATA:  CVA.  Endotracheal tube removal. EXAM: PORTABLE CHEST 1 VIEW COMPARISON:  03/26/2019 FINDINGS: The endotracheal tube has been removed. Associated mild hypoaeration with low lung volumes, vascular crowding and streaky bibasilar atelectasis. No edema or definite effusions. IMPRESSION: Hypoaeration status post extubation with low lung volumes and streaky bibasilar atelectasis. Electronically Signed   By: Mamie Nick.  Gallerani M.D.   On: 03/27/2019 12:16   Portable Chest Xray  Result Date: 03/26/2019 CLINICAL DATA:  Check endotracheal tube placement EXAM: PORTABLE CHEST 1 VIEW COMPARISON:  03/29/2019 FINDINGS: Cardiac shadow is stable. Loop recorder is seen. Endotracheal tube is noted in satisfactory position. Fullness in the left hilum is again identified but slightly less prominent. Nonemergent CT of the chest is recommended for further evaluation when the patient's condition improves. The lungs are well aerated without focal infiltrate. No bony abnormality is noted. IMPRESSION: Persistent fullness in the left hilum. CT is again recommended for further evaluation when clinically able. Electronically Signed   By: Inez Catalina M.D.   On: 03/26/2019 07:50   Portable Chest X-ray  Result Date: 04/11/2019 CLINICAL DATA:  Evaluate ET tube placement EXAM: PORTABLE CHEST 1 VIEW COMPARISON:  None FINDINGS: Endotracheal tube tip is above the carina. Normal  heart size. Within the left hilar region there is a mass measuring approximately 6.1 cm. Coarsened interstitial markings are identified bilaterally, likely reflecting emphysema. No superimposed airspace consolidation. IMPRESSION: 1. Left perihilar lung mass is identified. Recommend further evaluation with contrast enhanced CT of the chest. 2. Emphysema suspected. Electronically Signed   By: Kerby Moors M.D.   On: 04/20/2019 15:36   Ir Percutaneous Art Thrombectomy/infusion Intracranial Inc Diag Angio  Result Date: 03/27/2019 INDICATION: Aphasia, right-sided weakness. Occluded dominant inferior division of the left middle cerebral artery on CT angiogram of the head and neck. EXAM: 1. EMERGENT LARGE VESSEL OCCLUSION THROMBOLYSIS (anterior CIRCULATION) COMPARISON:  CT angiogram of the head and neck of 04/11/2019. MEDICATIONS: Ancef 2 g IV antibiotic was administered within 1 hour of the procedure. ANESTHESIA/SEDATION: General anesthesia. CONTRAST:  Isovue 300 approximately 60 mL. FLUOROSCOPY TIME:  Fluoroscopy Time: 47 minutes 0 seconds (1483 mGy). COMPLICATIONS: None immediate. TECHNIQUE: Following a full explanation of the procedure along with the potential associated complications, an informed witnessed consent was obtained from the patient's husband. The risks of intracranial hemorrhage of 10%, worsening neurological deficit, ventilator dependency, death and inability to revascularize were all reviewed in detail with the patient's husband. The patient was then put under general anesthesia by the Department of Anesthesiology at [facility]. The right groin was prepped and draped in the usual sterile fashion. Thereafter using modified Seldinger technique, transfemoral access into the right common femoral artery was obtained without difficulty. Over a 0.035 inch guidewire a 5 French Pinnacle sheath was inserted. Through this, and also over a 0.035 inch guidewire a 5 [place] JB 1 catheter was advanced to  the aortic arch region and selectively positioned in the left common carotid artery. FINDINGS: The left common carotid arteriogram demonstrates the left external carotid artery and its major branches to be widely patent. The left internal carotid artery at the bulb to the cranial skull base demonstrates wide patency. The petrous, cavernous segments are also widely patent. The left middle cerebral artery demonstrates complete angiographic occlusion of the left middle cerebral artery inferior division mid M2 region. The left anterior cerebral artery opacifies into the capillary and venous phases. Cross-filling via the anterior communicating artery of the right anterior cerebral artery and distally is noted. PROCEDURE: The diagnostic JB 1 catheter in the left common carotid artery was then exchanged over a 0.035 inch 300 cm Rosen exchange guidewire for an 8 [place] short Pinnacle sheath. Good aspiration was obtained from the hub of the 8 [place] Pinnacle sheath. This was then connected to continuous heparinized saline infusion. Over the exchange guidewire, an 10  cm 8 [place] Neuron Max guide sheath was then advanced to the mid left internal carotid cervical segment. The guidewire was removed. Good aspiration obtained from the hub of the Neuron Max sheath. A gentle control arteriogram performed through this demonstrates no evidence of spasms, dissections or of intraluminal filling defects. In a coaxial manner and with constant heparinized saline infusion using biplane roadmap technique and constant fluoroscopic guidance, a combination of a 6 [place] Catalyst 132 cm guide catheter inside of which was a Velocity 160 027 microcatheter was advanced over a 016 inch Fathom micro guidewire to the distal end of the Neuron Max sheath. With the micro guidewire leading with a J-tip configuration to avoid dissections and cause vasospasm, combination was navigated to the supraclinoid left ICA. Using a torque device, access into the M2  M3 region of the inferior division was obtained with the micro guidewire followed by the microcatheter. The guidewire was removed. Slow aspiration of blood was noted from the hub of the microcatheter. A gentle contrast injection demonstrated antegrade flow into the distal M3 M4 regions of the inferior division. A 5 mm x 33 mm Embotrap retrieval device was then advanced to the distal end of the microcatheter. The O ring on the delivery microcatheter was then loosened. With slight gentle forward traction with the right hand on the delivery micro guidewire, with the left hand the delivery microcatheter was retrieved unsheathing the distal and then the proximal portion of the retrieval device. The 6 [place] Catalyst guide catheter was now advanced into the distal M1 segment just inside of the origin of the inferior division. Thereafter, with constant aspiration being applied with the Penumbra aspiration device at the hub of the Catalyst guide catheter, and also with a 60 mL syringe at the hub of the Neuron Max sheath, the combination of the retrieval device, the microcatheter and the 6 [place] Catalyst guide catheter was then retrieved and removed. Aspiration was continued at the hub of the Neuron Max sheath. There were a few specks of clot material noted in the interstices of the retrieval device. However, a control arteriogram performed through the Neuron Max sheath demonstrated no significant change in the inferior division of the left middle cerebral artery. A second pass was then made again using the above combination again after having accessed the M2 M3 region of the inferior division with the microcatheter over a micro guidewire. Again after having established safe position of the tip of the microcatheter, the device was then deployed. Again with the Catalyst guide catheter now advanced just inside the inferior division origin to engage the clot, constant aspiration with a Penumbra aspiration device was then  deployed for approximately 2 minutes following the deployment of the retrieval device. Thereafter, with constant aspiration with a 60 mL syringe the hub of the Neuron Max sheath, the combination of the retrieval device, the 6 [place] Catalyst guide catheter and the microcatheter were retrieved and removed with the Penumbra aspiration being continued at the hub of the Catalyst guide catheter. No clot was seen in the aspirate. A control arteriogram performed through the Neuron Max sheath in the distal left internal carotid artery demonstrated only marginal improvement in the revascularization of the inferior division into the M3 region. A third attempt was then made again using the above combination except for the microcatheter being an 021 Trevo ProVue microcatheter which was advanced into the M2 M3 region of the inferior division of the left middle cerebral artery. However, there was significant resistance to the  aspiration of blood at the hub of the microcatheter. The Microcatheter tip was then gently withdrawn more proximally until there was mild aspiration. Very gentle contrast injection demonstrated opacification with stagnation of contrast distal to the microcatheter tip. Although there was a hint of contrast noted which remained static without clearance suspicious of subarachnoid extension of the contrast material. The procedure was therefore stopped. The microcatheter was retrieved and removed. A final control arteriogram performed through the 8 French Neuron Max achieved in the left internal carotid artery continued to demonstrate modestly improved caliber and flow through the left MCA inferior division with a near revascularization. The 8 [place] Neuron Max sheath was then retrieved and removed. The 8 French Pinnacle sheath was then removed with the successful application of a 7 [place] ExoSeal closure device for hemostasis. The right groin appeared soft without evidence of hematoma or bleeding. Distal pulses  remained Dopplerable in the posterior tibial, and the dorsalis pedis branches in both feet unchanged. A flat panel CT of the brain demonstrated no evidence of mass effect or midline shift. However, there continued to be early changes of vasogenic edema in the left parietal region the core area of the brain seen on CT perfusion maps. Also there is now the presence of contrast +/-blood breakdown products overlying the left parietal convexity and also to a less degree the left facial area. During the procedure, the patient's blood pressure, and hemodynamic status remained stable. The patient was then transported to the neuro ICU to continue with post thrombectomy management. IMPRESSION: Status post endovascular revascularization of occluded left middle cerebral artery dominant inferior division with TICI 2b revascularization. PLAN: Follow-up as per referring MD. Electronically Signed   By: Luanne Bras M.D.   On: 03/26/2019 14:18   Ct Head Code Stroke Wo Contrast  Addendum Date: 03/28/2019   ADDENDUM REPORT: 04/14/2019 07:51 ADDENDUM: Study discussed by telephone with Dr. Duffy Bruce on 04/20/2019 at 0749 hours. Electronically Signed   By: Genevie Ann M.D.   On: 04/15/2019 07:51   Result Date: 04/23/2019 CLINICAL DATA:  Code stroke. 68 year old female with right side numbness upon waking. EXAM: CT HEAD WITHOUT CONTRAST TECHNIQUE: Contiguous axial images were obtained from the base of the skull through the vertex without intravenous contrast. COMPARISON:  Brain MRI, intracranial MRA and head CT 12/20/2018. FINDINGS: Brain: Encephalomalacia in the right posterior frontal and parietal lobe corresponding to the February infarct. No associated hemorrhage or mass effect. Mild ex vacuo enlargement of the right lateral ventricle. New cytotoxic edema in the left MCA territory most apparent on series 2, image 13 at the posterior left superior temporal gyrus. No associated hemorrhage or mass effect. Involvement of the  posterior left insula. Stable gray-white matter differentiation elsewhere. Normal basilar cisterns. Vascular: Mild Calcified atherosclerosis at the skull base. No suspicious left MCA M1 density although there might be an abnormal hyperdense left MCA branch on sagittal image 41. Skull: No acute osseous abnormality identified. Previous right suboccipital craniotomy. Sinuses/Orbits: Visualized paranasal sinuses and mastoids are stable and well pneumatized. Other: New circumscribed low-density scalp soft tissue nodule along the right forehead on series 3, image 12 has a benign appearance, perhaps sebaceous cyst. There is a similar new smaller right scalp lesion on series 3, image 22. negative visible orbits. ASPECTS [facility] Stroke Program Early CT Score) - Ganglionic level infarction (caudate, lentiform nuclei, internal capsule, insula, M1-M3 cortex): 5 - Supraganglionic infarction (M4-M6 cortex): 3 Total score (0-10 with 10 being normal): 8 IMPRESSION: 1. Acute Left MCA  infarct most apparent at the left superior temporal gyrus. No associated hemorrhage or mass effect. ASPECTS is 8. 2. No left M1 hyperdensity but possible hyperdense left MCA branch. 3. Expected evolution of the posterior right MCA infarct since February. Electronically Signed: By: Genevie Ann M.D. On: 04/05/2019 07:46   Vas [place] Lower Extremity Venous (dvt)  Result Date: 03/28/2019  Lower Venous Study Indications: Stroke.  Limitations: Patient positioning, patient immobility. Performing Technologist: Oliver Hum RVT  Examination Guidelines: A complete evaluation includes B-mode imaging, spectral Doppler, color Doppler, and power Doppler as needed of all accessible portions of each vessel. Bilateral testing is considered an integral part of a complete examination. Limited examinations for reoccurring indications may be performed as noted.  +---------+---------------+---------+-----------+----------+-------+  RIGHT      Compressibility Phasicity Spontaneity Properties Summary  +---------+---------------+---------+-----------+----------+-------+  CFV       Full            Yes       Yes                             +---------+---------------+---------+-----------+----------+-------+  SFJ       Full                                                      +---------+---------------+---------+-----------+----------+-------+  FV Prox   Full                                                      +---------+---------------+---------+-----------+----------+-------+  FV Mid    Full                                                      +---------+---------------+---------+-----------+----------+-------+  FV Distal Full                                                      +---------+---------------+---------+-----------+----------+-------+  PFV       Full                                                      +---------+---------------+---------+-----------+----------+-------+  POP       Full            Yes       Yes                             +---------+---------------+---------+-----------+----------+-------+  PTV       Full                                                      +---------+---------------+---------+-----------+----------+-------+  PERO      Full                                                      +---------+---------------+---------+-----------+----------+-------+   +---------+---------------+---------+-----------+----------+-------+  LEFT      Compressibility Phasicity Spontaneity Properties Summary  +---------+---------------+---------+-----------+----------+-------+  CFV       Full            Yes       Yes                             +---------+---------------+---------+-----------+----------+-------+  SFJ       None                                             Acute    +---------+---------------+---------+-----------+----------+-------+  FV Prox   Full                                                       +---------+---------------+---------+-----------+----------+-------+  FV Mid    Full                                                      +---------+---------------+---------+-----------+----------+-------+  FV Distal Full                                                      +---------+---------------+---------+-----------+----------+-------+  PFV       Full                                                      +---------+---------------+---------+-----------+----------+-------+  POP       Full            Yes       Yes                             +---------+---------------+---------+-----------+----------+-------+  PTV       Partial                                          Acute    +---------+---------------+---------+-----------+----------+-------+  PERO      Full                                                      +---------+---------------+---------+-----------+----------+-------+  GSV       None                                             Acute    +---------+---------------+---------+-----------+----------+-------+ SVT at [facility] in the GSV.    Summary: Right: There is no evidence of deep vein thrombosis in the lower extremity. No cystic structure found in the popliteal fossa. Left: Findings consistent with acute deep vein thrombosis involving the left posterior tibial vein. Findings consistent with acute superficial vein thrombosis involving the left great saphenous vein. No cystic structure found in the popliteal fossa.  *See table(s) above for measurements and observations. Electronically signed by Monica Martinez MD on 03/28/2019 at 5:25:02 PM.    Final     ASSESSMENT: This is a very pleasant 68 years old white female with recurrent stroke and recent hemorrhagic stroke and she is uncooperative.  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   PLAN: I had a lengthy discussion with the husband today about his wife's condition, prognosis and treatment  options. Unfortunately the patient has very poor performance status and completely uncooperative and likely to die from her stroke before the breast cancer. I strongly recommended for the patient to consider palliative care and hospice at this point. I do not see a value for any additional investigation or treatment for her stage IV triple negative breast cancer as the patient is life expectancy is very short from the current stroke and her performance status is very poor. After a lengthy discussion the patient agreed to the current plan. I asked him to call if there is any significant change or improvement in her general condition which is unlikely.  The patient voices understanding of current disease status and treatment options and is in agreement with the current care plan.  All questions were answered. The patient knows to call the clinic with any problems, questions or concerns. We can certainly see the patient much sooner if necessary.  Thank you so much for allowing me to participate in the care of Megan Hancock. I will continue to follow up the patient with you and assist in her care.  Disclaimer: This note was dictated with voice recognition software. Similar sounding words can inadvertently be transcribed and may not be corrected upon review.   Eilleen [place] March 30, 2019, 12:26 PM

## 2019-03-30 NOTE — Progress Notes (Signed)
  Speech Language Pathology Treatment: Dysphagia  Patient Details Name: Megan Hancock MRN: [id number] DOB: 10/12/1951 Today's Date: 03/30/2019 Time: 1016-1027 SLP Time Calculation (min) (ACUTE ONLY): 11 min  Assessment / Plan / Recommendation Clinical Impression  PO trials attempted after limited oral care d/t pt combativeness/agitation likely d/t pain as she was grimacing throughout session; ice chips presented one at at time with max encouragement/multimodal cues from SLP to swallow, but minimal oral propulsion/prep noted and a swallow was not initiated; delayed coughing noted after >2 min; husband present and educated re: swallowing capability d/t nature of CVA and prognosis; primary nutrition being provided non-orally; aphasia persists with pt exhibiting limited awareness/ability to follow simple commands for oral tasks during treatment and no verbal responses; ST will continue efforts for PO readiness/aphasia as appropriate.  HPI HPI: Megan Hancock is a 68 y.o. 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 MRI Large area of acute infarction roughly corresponding to the posterior/inferior division LEFT MCA, proximal M2 occlusion, Intracranial hemorrhage following reperfusion therapy consisting of both parenchymal hematoma, as well as subarachnoid hemorrhage.      SLP Plan  Continue with current plan of care       Recommendations  Diet recommendations: NPO Medication Administration: Via alternative means                General recommendations: Other(comment)(TBD) Oral Care Recommendations: Oral care QID Follow up Recommendations: Other (comment);24 hour supervision/assistance(TBD) SLP Visit Diagnosis: Dysphagia, unspecified (R13.10) Plan: Continue with current plan of care                      Elvina Sidle,  M.S., CCC-SLP 03/30/2019, 12:53 PM

## 2019-03-30 NOTE — Progress Notes (Signed)
Assisted tele visit to patient with husband.  Hadiya Spoerl Samson, RN  

## 2019-03-31 ENCOUNTER — Encounter (HOSPITAL_COMMUNITY): Payer: Self-pay | Admitting: Radiology

## 2019-03-31 ENCOUNTER — Inpatient Hospital Stay (HOSPITAL_COMMUNITY): Payer: Medicare Other

## 2019-03-31 DIAGNOSIS — Z7189 Other specified counseling: Secondary | ICD-10-CM

## 2019-03-31 LAB — BASIC METABOLIC PANEL
Anion gap: 13 (ref 5–15)
BUN: 29 mg/dL — ABNORMAL HIGH (ref 8–23)
CO2: 20 mmol/L — ABNORMAL LOW (ref 22–32)
Calcium: 8.3 mg/dL — ABNORMAL LOW (ref 8.9–10.3)
Chloride: 106 mmol/L (ref 98–111)
Creatinine, Ser: 1.05 mg/dL — ABNORMAL HIGH (ref 0.44–1.00)
GFR calc Af Amer: >60 mL/min (ref >60)
GFR calc non Af Amer: 56 mL/min — ABNORMAL LOW (ref >60)
Glucose, Bld: 197 mg/dL — ABNORMAL HIGH (ref 70–99)
Potassium: 4.4 mmol/L (ref 3.5–5.1)
Sodium: 139 mmol/L (ref 135–145)

## 2019-03-31 LAB — LACTIC ACID, PLASMA: Lactic Acid, Venous: 2.4 mmol/L (ref 0.5–1.9)

## 2019-03-31 LAB — GLUCOSE, CAPILLARY
Glucose-Capillary: 139 mg/dL — ABNORMAL HIGH (ref 70–99)
Glucose-Capillary: 157 mg/dL — ABNORMAL HIGH (ref 70–99)
Glucose-Capillary: 172 mg/dL — ABNORMAL HIGH (ref 70–99)

## 2019-03-31 LAB — CULTURE, BLOOD (ROUTINE X 2)
Culture: NO GROWTH
Culture: NO GROWTH
Special Requests: ADEQUATE
Special Requests: ADEQUATE

## 2019-03-31 LAB — CBC
HCT: 32 % — ABNORMAL LOW (ref 36.0–46.0)
Hemoglobin: 10.2 g/dL — ABNORMAL LOW (ref 12.0–15.0)
MCH: 27.1 pg (ref 26.0–34.0)
MCHC: 31.9 g/dL (ref 30.0–36.0)
MCV: 85.1 fL (ref 80.0–100.0)
Platelets: 306 10*3/uL (ref 150–400)
RBC: 3.76 MIL/uL — ABNORMAL LOW (ref 3.87–5.11)
RDW: 14.6 % (ref 11.5–15.5)
WBC: 9.9 10*3/uL (ref 4.0–10.5)
nRBC: 0.2 % (ref 0.0–0.2)

## 2019-03-31 MED ORDER — GLYCOPYRROLATE 0.2 MG/ML IJ SOLN
0.2000 mg | INTRAMUSCULAR | Status: DC | PRN
  Administered 2019-03-31: 0.2 mg via INTRAVENOUS
  Filled 2019-03-31 (×2): qty 1

## 2019-03-31 MED ORDER — IOHEXOL 350 MG/ML SOLN
80.0000 mL | Freq: Once | INTRAVENOUS | Status: AC | PRN
  Administered 2019-03-31: 60 mL via INTRAVENOUS

## 2019-03-31 MED ORDER — GLYCOPYRROLATE 1 MG PO TABS
1.0000 mg | ORAL_TABLET | ORAL | Status: DC | PRN
  Filled 2019-03-31: qty 1

## 2019-03-31 MED ORDER — LORAZEPAM 1 MG PO TABS: 1.0000 mg | ORAL_TABLET | ORAL | Status: DC | PRN

## 2019-03-31 MED ORDER — MORPHINE 100MG IN NS 100ML (1MG/ML) PREMIX INFUSION
2.0000 mg/h | INTRAVENOUS | Status: DC
  Administered 2019-03-31: 2 mg/h via INTRAVENOUS
  Filled 2019-03-31 (×3): qty 100

## 2019-03-31 MED ORDER — HALOPERIDOL LACTATE 2 MG/ML PO CONC
0.5000 mg | ORAL | Status: DC | PRN
  Filled 2019-03-31: qty 0.3

## 2019-03-31 MED ORDER — SODIUM CHLORIDE 0.9 % IV BOLUS
500.0000 mL | Freq: Once | INTRAVENOUS | Status: AC
  Administered 2019-03-31: 500 mL via INTRAVENOUS

## 2019-03-31 MED ORDER — MORPHINE SULFATE (PF) 2 MG/ML IV SOLN
2.0000 mg | INTRAVENOUS | Status: DC | PRN
  Administered 2019-03-31 (×2): 2 mg via INTRAVENOUS
  Filled 2019-03-31 (×2): qty 1

## 2019-03-31 MED ORDER — POLYVINYL ALCOHOL 1.4 % OP SOLN: 1.0000 [drp] | Freq: Four times a day (QID) | OPHTHALMIC | Status: DC | PRN

## 2019-03-31 MED ORDER — METOPROLOL TARTRATE 5 MG/5ML IV SOLN: 2.5000 mg | Freq: Once | INTRAVENOUS | Status: DC

## 2019-03-31 MED ORDER — SCOPOLAMINE 1 MG/3DAYS TD PT72
1.0000 | MEDICATED_PATCH | TRANSDERMAL | Status: DC
  Administered 2019-03-31: 1.5 mg via TRANSDERMAL
  Filled 2019-03-31 (×3): qty 1

## 2019-03-31 MED ORDER — HALOPERIDOL LACTATE 5 MG/ML IJ SOLN: 0.5000 mg | INTRAMUSCULAR | Status: DC | PRN

## 2019-03-31 MED ORDER — BIOTENE DRY MOUTH MT LIQD: 15.0000 mL | OROMUCOSAL | Status: DC | PRN

## 2019-03-31 MED ORDER — LORAZEPAM 2 MG/ML PO CONC: 1.0000 mg | ORAL | Status: DC | PRN

## 2019-03-31 MED ORDER — GLYCOPYRROLATE 0.2 MG/ML IJ SOLN
0.2000 mg | INTRAMUSCULAR | Status: DC | PRN
  Administered 2019-04-01: 0.2 mg via SUBCUTANEOUS

## 2019-03-31 MED ORDER — HALOPERIDOL 0.5 MG PO TABS
0.5000 mg | ORAL_TABLET | ORAL | Status: DC | PRN
  Filled 2019-03-31: qty 1

## 2019-03-31 MED ORDER — GLYCOPYRROLATE 0.2 MG/ML IJ SOLN
0.2000 mg | INTRAMUSCULAR | Status: DC | PRN
  Administered 2019-03-31: 0.2 mg via INTRAVENOUS
  Filled 2019-03-31: qty 1

## 2019-03-31 MED ORDER — MORPHINE BOLUS VIA INFUSION
2.0000 mg | INTRAVENOUS | Status: DC | PRN
  Administered 2019-03-31: 2 mg via INTRAVENOUS
  Filled 2019-03-31: qty 2

## 2019-03-31 MED ORDER — SODIUM CHLORIDE 0.9 % IV SOLN
1.5000 g | Freq: Four times a day (QID) | INTRAVENOUS | Status: DC
  Filled 2019-03-31 (×3): qty 1.5

## 2019-03-31 MED ORDER — GLYCOPYRROLATE 0.2 MG/ML IJ SOLN
0.2000 mg | Freq: Once | INTRAMUSCULAR | Status: AC
  Administered 2019-03-31: 0.2 mg via INTRAVENOUS
  Filled 2019-03-31: qty 1

## 2019-03-31 MED ORDER — ONDANSETRON HCL 4 MG/2ML IJ SOLN: 4.0000 mg | Freq: Four times a day (QID) | INTRAMUSCULAR | Status: DC | PRN

## 2019-03-31 MED ORDER — LORAZEPAM 2 MG/ML IJ SOLN
1.0000 mg | INTRAMUSCULAR | Status: DC | PRN
  Administered 2019-03-31: 1 mg via INTRAVENOUS
  Filled 2019-03-31: qty 1

## 2019-03-31 MED ORDER — ONDANSETRON 4 MG PO TBDP: 4.0000 mg | ORAL_TABLET | Freq: Four times a day (QID) | ORAL | Status: DC | PRN

## 2019-03-31 NOTE — Progress Notes (Signed)
Palliative Care Progress Note  Called to floor by RN for change in condition with increased WOB and secretions.  I saw and examined Megan Hancock and spoke with her husband and Therapist, sports at bedside.  On exam she is in bed in distress with increased WOB and gurgly secretions.  She is grossly aspirating.  I discussed with husband that I believe that Megan Hancock has likely aspirated and appears to me to be transitioning to actively dying.  Plan for morphine and robinul to see if we can symptomatically get her under better control.  I did not deescalate further care as I want to support her as best possible until her sons arrive.  I believe that it is possible she may die today and will begin process for visitation exception for her sons.  Page placed to primary attending.  Total time: 30 minutes Greater than 50%  of this time was spent counseling and coordinating care related to the above assessment and plan.  Micheline Rough, MD [facility] [phone]

## 2019-03-31 NOTE — Progress Notes (Signed)
CXR not impressive for pneumonia Lactate elevated 2.4 WBC down to 9.9 from 14.2 Pending chest imaging for PE. Stroke team to continue to follow.  -- Amie Portland, MD [facility] Pager: [phone] If 7pm to 7am, please call on call as listed on AMION.

## 2019-03-31 NOTE — Progress Notes (Addendum)
Labs reviewed. Mildly increased WBC count on 6/6 compared to 6/5 CXR -pending at this time EKG - sinus tachycardia Hypoxic - sPO2 in mid 70s, with O2 supplementation up to 90 Neurological exam unchanged from prior documented exam.   Impression: Evaluate for aspiration pneumonia Evaluate for PE - tachycardia, hypoxia, h/o DVT not on AC Evaluate for worsening ICH  Recs: -CTA chest PE protocol. -CT head w/o contrast -CXR -Tracheal aspirate for culture when able to   -Empiric coverage with Unasyn for presumed aspiration. -Will add lactate for now to the labs -Draw AM labs now along with the lactate  Stroke neurology will follow.  -- Amie Portland, MD [facility] Pager: [phone] If 7pm to 7am, please call on call as listed on AMION.

## 2019-03-31 NOTE — Progress Notes (Signed)
Called for HR sustained in 130-140s - now for about 20-30 min. Known to have tachycardia previously that resolved. Was considered secondary to metastatic pain.  Recs 500 cc NS bolus Morphine prn 12 lead EKG   -- Amie Portland, MD [facility] Pager: [phone] If 7pm to 7am, please call on call as listed on AMION.

## 2019-03-31 NOTE — Progress Notes (Signed)
STROKE TEAM PROGRESS NOTE   INTERVAL HISTORY Patient has shown worsening in respiratory status as well as mental status since yesterday.  CT scan of the head showed evolving left hemispheric stroke without acute findings.  Chest x-ray suggests aspiration and CT scan of the chest showed pulmonary metastasis.  Patient is now on nonrebreather and in respiratory distress.  Morphine has been started.  Palliative care team has met with husband who is now agreeing to full comfort care measures but wants to survive till sounds having a portion need to visit her later today.  The patient's husband met with Dr. Mohamed from oncology yesterday who reiterated patient's poor prognosis given from her aggressive cancer and treatment for cancer is not an option at this time  Vitals:   03/31/19 0306 03/31/19 0400 03/31/19 0500 03/31/19 0731  BP:  111/74    Pulse:  (!) 147    Resp:  14 14   Temp:  99.6 F (37.6 C)    TempSrc:  Axillary    SpO2:  (!) 74% 91% 92%  Weight: 77.7 kg     Height:        CBC:  Recent Labs  Lab 04/04/2019 0726  03/26/19 0146  03/30/19 0535 03/31/19 0544  WBC 12.9*  --  12.1*   < > 14.2* 9.9  NEUTROABS 9.5*  --  10.3*  --   --   --   HGB 11.2*   < > 9.8*   < > 10.5* 10.2*  HCT 35.3*   < > 31.4*   < > 32.3* 32.0*  MCV 86.7  --  87.2   < > 84.3 85.1  PLT 256  --  251   < > 279 306   < > = values in this interval not displayed.    Basic Metabolic Panel:  Recent Labs  Lab 03/30/19 0535 03/30/19 1629 03/31/19 0544  NA 140  --  139  K 4.0  --  4.4  CL 106  --  106  CO2 22  --  20*  GLUCOSE 169*  --  197*  BUN 24*  --  29*  CREATININE 0.89  --  1.05*  CALCIUM 8.6*  --  8.3*  MG 1.9 1.9  --   PHOS 3.2 3.2  --    Lipid Panel:     Component Value Date/Time   CHOL 97 03/26/2019 0146   TRIG 98 03/26/2019 0146   HDL 39 (L) 03/26/2019 0146   CHOLHDL 2.5 03/26/2019 0146   VLDL 20 03/26/2019 0146   LDLCALC 38 03/26/2019 0146   HgbA1c:  Lab Results  Component Value  Date   HGBA1C 5.7 (H) 03/26/2019   Urine Drug Screen: No results found for: LABOPIA, COCAINSCRNUR, LABBENZ, AMPHETMU, THCU, LABBARB  Alcohol Level No results found for: ETH  IMAGING  Dg Chest Port 1 View 03/28/2019 IMPRESSION:  Mild right basilar atelectasis.   Cerebral angiogram  Partial revascularization of occluded Lt MCA  Inf division with x 2 passes with 5mm x 33mm embotrap retriever device  And penumbra aspiration achieving a TICI 2a/2b revascularization  2D Echocardiogram  1. The left ventricle has normal systolic function with an ejection fraction of 60-65%. The cavity size was normal. Left ventricular diastolic Doppler parameters are consistent with pseudonormalization. No evidence of left ventricular regional wall  motion abnormalities.  2. The right ventricle has normal systolic function. The cavity was normal. There is no increase in right ventricular wall thickness.  3. The pericardial   effusion is posterior to the left ventricle.  4. Trivial pericardial effusion is present.  5. There is mild to moderate mitral annular calcification present.  6. The aortic valve is tricuspid. Moderate stenosis of the aortic valve.  7. The aortic root and ascending aorta are normal in size and structure.  8. The interatrial septum was not well visualized.   PHYSICAL EXAM  Temp:  [98.7 F (37.1 C)-99.9 F (37.7 C)] 99.6 F (37.6 C) (06/07 0400) Pulse Rate:  [90-147] 147 (06/07 0400) Resp:  [14-20] 14 (06/07 0500) BP: (98-127)/(56-74) 111/74 (06/07 0400) SpO2:  [74 %-95 %] 92 % (06/07 0731) Weight:  [77.7 kg] 77.7 kg (06/07 0306)  General -middle-aged Caucasian lady not in acute distress.  She has a core track feeding tube  Ophthalmologic - fundi not visualized due to noncooperation.  Cardiovascular - Regular hythm, but tachycardia.  Neuro - awake alert, not in distress, eyes open, globally aphasic not following commands except intermittent close eyes on request, nonverbal. Eyes  in left gaze position, inconsistently blinking to visual threat on the left, not blinking to visual threat on the right, not tracking on the right, PERRL. Right facial droop, tongue midline in mouth. Spontaneous raising up LUE at least 3+/5, with pain stimulation LLE 2+/5 and RLE with mild withdraw, no movement of RUE. DTR diminished and no babinski. Sensation, coordination and gait not tested.   ASSESSMENT/PLAN Ms. KARRI KALLENBACH is a 68 y.o. female with history of COPD, HLD, R MCA stroke in February 2020, recent diagnosis of metastatic breast cancer presenting to [facility] with expressive aphasia.  Out of the TPA window.  Found to have a left M2 LVO transferred to [facility] and taken to IR.   Stroke:   L MCA infarct s/p parital IR L M2 with post revascularization hemorrhage and cerebral edema - embolic could be secondary to hypercoagulable state from metastatic breast cancer   Code Stroke CT head Acute L MCA infarct. Possible hyperdense L MCA branch. Expected evolution poster R MCA infarct since Feb.  CTA head & neck partially visible malignancy upper chest and neck w/ lymphadenopathy and 3-4 cm  L mediastinal mass. L M2 ELVO  CT perfusion 41m core infarct w/ 654mpenumbra and hypoperfusion index 0.5  Cerebral angio L MCA M2 occlusion with TICI 2a/2b revascularization   MRI - large left MCA infarct due to proximal M2 occlusion.  Intracranial hemorrhage following reperfusion consisting of both IPH of 30% of infarcted tissue as well as SAH, pH 2 and 3C.   2D Echo EF 60-65%. No source of embolus   TEE 11/2018 showed no PFO  Loop recorder - placed 12/31/2008 interrogation pending   LDL 38   HgbA1c 5.7  Heparin subq for VTE prophylaxis  aspirin 81 mg daily prior to admission, now on No antithrombotic given post revascularization hemorrhage  Therapy recommendations: Pending  Disposition: Pending  Palliative care on board and husband agreed with DNR but request to take her home with home  PT/OT once appropriate  Acute respiratory failure  COPD  Intubated for IR in ED   Extubated 6/2 w/ HR 100-110s afterwards  CTA neck showed partially visible malignancy upper chest and neck w/ lymphadenopathy and 3-4 cm  L mediastinal mass.  CCM on board  Received lasix once   Chest PT  3% saline nebulization  Secretion improved  Left LE DVT  LE venous doppler - acute DVT left posterior tibial vein  Likely related to advanced cancer  No AC  at this time due to hemorrhagic conversion and distal DVT  Close monitoring  On heparin subq for DVT prevention  Given hypercoagulable state, may consider anticoagulation once brain hemorrhage resolves.    Primary breast cancer with metastasis  New diagnosis, for evaluation by oncology on 03/27/2019 in [place]  CTA neck showed partially visible malignancy upper chest and neck w/ lymphadenopathy and 3-4 cm  L mediastinal mass.  Baseline dyspnea likely caused by metastatic disease  Records requested from [place] 6/4 - not received yet  PCM is following  Hx stroke/TIA  11/2018 -embolic right MCA infarct with resultant short-term memory deficits and imbalance, TEE neg, loop recorder placed, put on aspirin and statin  Fever  T-max 100.6->afebrile  Leukocytosis, likely reactive 12.1->12.4->11.4->13.1->14.2 (afebrile)  UA negative  Urine culture no growth  Blood culture no growth for 3 days  CXR 03/28/19 persistent fullness in the left hilum due to cancer  Tachycardia, resolved  HR < 100 now  Could be related to pain at back at the metastasis site as per husband  On morphine oral PRN  Blood pressure   Home meds: None  BP stable  . BP goal < 160 given hemorrhagic conversion  Hyperlipidemia  Home meds: Zocor 40  LDL 38, goal < 70  Resume half-strength Zocor once able to swallow   Continue statin at discharge  Dysphagia  Secondary to stroke  Failed swallow  Speech following  On gentle IVF  Cortrak  placed 03/29/19 - will start TF  Consider PEG next week if pt not able to swallow   Hyperglycemia - 169 - may need to change supplemental feeding solution (osmolite with additional sugar free supplement) - monitor (SSI already ordered)  Other Stroke Risk Factors  Advanced age  History ETOH use  Family hx stroke (daughter)  Other Active Problems  Acute blood loss anemia post IR Hgb 9.8->10->9.7->10.5  Hypokalemia 4.1->3.4->3.8->4.0  Acute urinary retention, foley inserted  Hospital day # 6  The patient has unfortunately a large left MCA infarct despite mechanical thrombectomy and has significant global aphasia and dense right hemiplegia and prognosis is poor.  She is also has a recent diagnosis of very aggressive breast cancer from which treatment has not yet been initiated but unfortunately may not be possible anymore due to the disabling stroke.  She has in the interim developed aspiration pneumonia and is actively dying and family agrees to comfort care measures only.  We have started her on morphine and will move her to hospice floor when bed becomes available.  We await arrival of her son's.  Appreciate help from the palliative care team.  I again had long discussion with husband at bedside, updated pt current condition, treatment plan and poor prognosis.  He expressed understanding and appreciation.  .  Greater than 50% time during this 25-minute visit was spent on counseling and coordination of care about her embolic stroke, recent diagnosis of cancer and discussion about prognosis and plan of care and answering questions. Pramod Sethi, MD  To contact Stroke Continuity provider, please refer to [email or web address]. After hours, contact General Neurology 

## 2019-03-31 NOTE — Progress Notes (Signed)
Continues to be tachycardic. Resp therapy called for deep suctioning as she has some gurgling on exam per RN. Suspect aspiration due to patient manipulating her feeding tube. CXR ordered. EKG pending. May need betablocker if continues to tachy. May consider IM consultation as well. Will continue to follow as soon as EKG becomes available.  -- Amie Portland, MD [facility] Pager: [phone] If 7pm to 7am, please call on call as listed on AMION.

## 2019-03-31 NOTE — Progress Notes (Signed)
CM consult noted. CM unable to obtain information from [place] today d/t it being Sunday. CM following.

## 2019-03-31 NOTE — Progress Notes (Signed)
Morphine drip increased to 4mg /hr. Also prn ativan administered to improve comfort levels. Family at bedside

## 2019-04-01 DIAGNOSIS — R0600 Dyspnea, unspecified: Secondary | ICD-10-CM

## 2019-04-01 DIAGNOSIS — K117 Disturbances of salivary secretion: Secondary | ICD-10-CM

## 2019-04-01 MED ORDER — GLYCOPYRROLATE 0.2 MG/ML IJ SOLN: 0.2000 mg | Freq: Four times a day (QID) | INTRAMUSCULAR | Status: DC

## 2019-04-01 MED ORDER — GLYCOPYRROLATE 1 MG PO TABS: 1.0000 mg | ORAL_TABLET | Freq: Four times a day (QID) | ORAL | Status: DC

## 2019-04-01 MED ORDER — GLYCOPYRROLATE 0.2 MG/ML IJ SOLN
0.4000 mg | Freq: Four times a day (QID) | INTRAMUSCULAR | Status: DC
  Administered 2019-04-01: 0.4 mg via SUBCUTANEOUS
  Filled 2019-04-01: qty 2

## 2019-04-01 MED ORDER — GLYCOPYRROLATE 0.2 MG/ML IJ SOLN: 0.4000 mg | Freq: Four times a day (QID) | INTRAMUSCULAR | Status: DC

## 2019-04-24 NOTE — Progress Notes (Signed)
Patient ID: ZAKYIA GAGAN, female   DOB: Apr 18, 1951, 68 y.o.   MRN: [id number]  This NP visited patient at the bedside as a follow up for palliative medicine needs and emotional support.  Patient is unresponsive, on morphine gtt, appears comfortable and to be transitioning at EOL. Audible throat/terminal secretions noted    Focus of care is comfort and dignity.  Prognosis is likely hours  Husband and son at bedside.   Expecting daughter from out of town this afternoon.  Discussed natural trajectory and expectations at EOL.  Will add scheduled Rubinol.  Questions and concerns addressed     Discussed with bedside RN  Total time spent on the unit was 35 minutes  Greater than 50% of the time was spent in counseling and coordination of care  Wadie Lessen NP  Palliative Medicine Team Team Phone # [phone] Pager [phone]

## 2019-04-24 NOTE — Death Summary Note (Signed)
Patient ID: KWEEN BACORN MRN: [id number] DOB/AGE: 66-Jan-1952 66 y.o.  Admit date: 04/12/2019 Death date: April 19, 2019 at 1549  Admission Diagnoses: Unable to speak  Cause of Death: Respiratory failure secondary to aspiration pneumonia from large left MCA infarct with resultant global aphasia and right hemiplegia status post revascularization of the left M2 with mechanical thrombectomy with post revascularization hemorrhage and cytotoxic edema.  Etiology of stroke hypercoagulability from metastatic breast cancer.  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 female past medical history of COPD, hyperlipidemia, right MCA stroke in February and recent diagnosis of metastatic breast cancer presents to [facility] regional ED after her husband noted patient unable to speak.  Last seen normal was 11 PM last night by her husband.  He does she got up around 4 to go to the bathroom.  Teleneurology was consulted at [facility].  Patient underwent a stat CT head which showed early ischemic changes in the left MCA territory.  CT angiogram was performed showed a left M2 occlusion and CT perfusion showed a 31 cc core with approximately equal penumbra.  Patient was transferred to [facility] for IR.  Patient prior to this was independent and had no residual deficits from her right MCA stroke.  Diagnosis of breast  cancer was recent and patient has still not started therapy.  Prognosis not clear at this time.  On arrival to [facility], patient's NIH stroke scale was 14.  Blood pressure was 338 systolic.  Patient was intubated in the ED (given protocol change in the setting of COVID).  Blood sugar was 68 per [facility], CBG was 106 at [facility] Date last known well: 5.31.20 Time last known well: 11pm tPA Given: no, outside window NIHSS: 14 Baseline MRS 0   CT perfusion 31 mL cord infarct with 62 mL penumbra and hypoperfusion index of 0.5.  CT angiogram showed partially visible malignancy in the upper chest with neck with lymphadenopathy.  She underwent emergent cerebral arteriogram with mechanical thrombectomy of the left MCA inferior division occlusion achieving tiki to be revascularization on 2019/04/12 by Dr. Estanislado Pandy.  2D echo was unremarkable TEE done in February 2020 had shown no PFO.  She had loop recorder placed in March 2020 so for paroxysmal A. fib had not been found.  The patient was extubated post procedure but remained globally aphasic with dense right hemiplegia.  And having copious secretions and not being able to swallow.  Palliative care team were consulted her condition gradually declined patient initially was made DNR and subsequently she developed aspiration pneumonia and family realize that her prognosis from the cancer itself is so poor that she would not be able to get any chemotherapy or cancer treatment till she made significant neurological recovery which was not possible.  After speaking to oncologist Dr. 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  Her condition gradually declined and she passed away peacefully.   Signed: Antony Contras 2019-04-27, 4:56 PM

## 2019-04-24 NOTE — Progress Notes (Signed)
Daughter of pt arrived from [place] to spend time with pt.  Pt is comfortable, on morphine drip at 4 and receiving Robinol scheduled for management of secretions.  Cortrak DC'd and converted mask to nasal cannula.  Oral care done.

## 2019-04-24 NOTE — Progress Notes (Signed)
Pt passed away at 1549, verified by Esperanza Richters, RN, CN.  All family at bedside.

## 2019-04-24 NOTE — Progress Notes (Signed)
Nutrition Brief Note RD working remotely. Chart reviewed. Pt now transitioning to comfort care.  No further nutrition interventions warranted at this time.  Please re-consult as needed.   Wynne Jury A. Alexia Dinger, RD, LDN, CDCES Registered Dietitian II Certified Diabetes Care and Education Specialist Pager: [phone] After hours Pager: [phone]  

## 2019-04-24 NOTE — Social Work (Signed)
CSW acknowledging pt under comfort care, currently not stable for transport. Will follow for disposition should hospice services or residential hospice become appropriate.   Alexander Mt, [place] Work [phone]

## 2019-04-24 NOTE — Care Management Important Message (Signed)
Important Message  Patient Details  Name: Megan Hancock MRN: [id number] Date of Birth: [birth year]   Medicare Important Message Given:  Yes    Memory Argue 2019/04/15, 4:45 PM

## 2019-04-24 NOTE — TOC Initial Note (Addendum)
[facility]) - Initial/Assessment Note    Patient Details  Name: Megan Hancock MRN: [id number] Date of Birth: August 09, 1951  Transition of Care [facility]) CM/SW Contact:    Alexander Mt, [place] Phone Number: 2019-04-11, 1:06 PM  Clinical Narrative:                 CSW spoke with pt husband at bedside. Pt appears comfortable and pt husband is grateful for care here at hospital. Pt children visited yesterday and her daughter will come today. Pt and pt husband from [place] originally. Pt husband hopes all his children can come and be with her today. Medicare important notice given to him but he understands that we currently are looking for ensure pt remains comfortable until she passes.  He is aware of CSW should he have any additional needs or concerns.    Barriers to Discharge: No Barriers Identified   Patient Goals and CMS Choice Patient states their goals for this hospitalization and ongoing recovery are:: for her to be comfortable      Expected Discharge Plan and Services         Living arrangements for the past 2 months: Single Family Home                                      Prior Living Arrangements/Services Living arrangements for the past 2 months: Single Family Home Lives with:: Spouse Patient language and need for interpreter reviewed:: Yes(no needs) Do you feel safe going back to the place where you live?: No   hospital death expected  Need for Family Participation in Patient Care: Yes (Comment)(end of life comfort) Care giver support system in place?: Yes (comment)(spouse; adult children)   Criminal Activity/Legal Involvement Pertinent to Current Situation/Hospitalization: No - Comment as needed  Activities of Daily Living      Permission Sought/Granted Permission sought to share information with : Family Supports Permission granted to share information with : No(pt at end of life)  Share Information with NAME: Billiejean Schimek     Permission  granted to share info w Relationship: husband  Permission granted to share info w Contact Information: [phone]  Emotional Assessment Appearance:: Appears stated age Attitude/Demeanor/Rapport: Unresponsive Affect (typically observed): Unable to Assess Orientation: : (disoriented x4) Alcohol / Substance Use: Not Applicable Psych Involvement: No (comment)  Admission diagnosis:  Stroke [facility]) [I63.9] Acute ischemic left MCA stroke [facility]) [I63.512] Patient Active Problem List   Diagnosis Date Noted  . Dysphagia   . 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 Palliative care by specialist   . DNR (do not resuscitate)   . Metastatic breast cancer ([place])   . Other dysphagia   . Cerebrovascular accident (CVA) ([place])   . Acute ischemic left MCA stroke ([place]) 04/05/2019  . Middle cerebral artery embolism, left 04/14/2019  . Acute respiratory failure ([place])   . Acute ischemic stroke ([place]) 12/19/2018   PCP:  Patient, No Pcp Per Pharmacy:   [place], [place] [address] [place] [address] Phone: [phone] Fax: [phone]     Social Determinants of Health (SDOH) Interventions    Readmission Risk Interventions No flowsheet data found.

## 2019-04-24 NOTE — Progress Notes (Signed)
Pt's husband, Fifi Schindler, states that the funeral home is in [place] where they are from as they were here visiting their family.  Stann Ore is the funeral dir of the [employer] in [place], [place].  The funeral home here accepting the pt is [facility] in [place].  Contacted [employer] and they stated potential donor for eyes only.    Morphine drip wasted 10cc and witnessed by Heywood Footman., RN.

## 2019-04-24 NOTE — Progress Notes (Signed)
STROKE TEAM PROGRESS NOTE   INTERVAL HISTORY Patient was transferred to the hospice unit yesterday.  She is on morphine drip.  She is requiring frequent suctioning.  T-max is 102.4 and she is hypotensive.  Her husband is at the bedside.  Her sons visited yesterday and she is awaiting other family members arrival today  Vitals:   03/31/19 0730 03/31/19 0731 03/31/19 1531 2019-04-21 0608  BP: 119/64  (!) 65/41 (!) 72/36  Pulse: (!) 142  100 (!) 117  Resp: (!) 23     Temp: (!) 102.4 F (39.1 C)  (!) 97.4 F (36.3 C) (!) 100.4 F (38 C)  TempSrc: Axillary  Axillary Oral  SpO2: 93% 92% (!) 86% 100%  Weight:      Height:        PHYSICAL EXAM  General -middle-aged Caucasian lady  in mild respiratory distress.  She is on morphine drip  .Cardiovascular - Regular hythm, but tachycardia. Respiratory system bilateral  conducted sounds Neuro -patient is obtunded.  Eyes are closed.  She does not open eyes even to sternal rub.  She is globally aphasic and not following any commands.  Right side is flaccid and weak.  She does withdraw the left side to painful stimuli.  ASSESSMENT/PLAN [name] is a 68 y.o. 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  Out of the TPA window.  Found to have a left M2 LVO transferred to [facility] and taken to IR.   Stroke:   L MCA infarct s/p parital IR L M2 with post revascularization hemorrhage and cerebral edema - embolic could be secondary to hypercoagulable state from metastatic breast cancer   Code Stroke CT head Acute L MCA infarct. Possible hyperdense L MCA branch. Expected evolution poster R MCA infarct since Feb.  CTA head & neck partially visible malignancy upper chest and neck w/ lymphadenopathy and 3-4 cm  L mediastinal mass. L M2 ELVO  CT perfusion 42mL core infarct w/ 106mL penumbra and hypoperfusion index 0.5  Cerebral angio L MCA  M2 occlusion with TICI 2a/2b revascularization   MRI - large left MCA infarct due to proximal M2 occlusion.  Intracranial hemorrhage following reperfusion consisting of both IPH of 30% of infarcted tissue as well as SAH, pH 2 and 3C.   2D Echo EF 60-65%. No source of embolus   TEE 11/2018 showed no PFO  Loop recorder - placed 12/31/2008 interrogation pending   LDL 38   HgbA1c 5.7  aspirin 81 mg daily prior to admission, now on No antithrombotic given post revascularization hemorrhage  Disposition: Pending  Patient made comfort care over the weekend following decline  Symptomatic management with ativan, robinul and morphine  Appears to be actively dying with increased HR, low BP  Continue comfort care  Acute respiratory failure  COPD  Intubated for IR in ED   Extubated 6/2 w/ HR 100-110s afterwards  CTA neck showed partially visible malignancy upper chest and neck w/ lymphadenopathy and 3-4 cm  L mediastinal mass.  CCM on board  Received lasix once   Chest PT  3% saline nebulization  Secretion improved  Left LE DVT  LE venous doppler - acute DVT left posterior tibial vein  Likely related to advanced cancer  No AC due to hemorrhagic conversion and distal DVT  Primary breast cancer with metastasis  New diagnosis, for evaluation by oncology on 03/27/2019 in  [place]  CTA neck showed partially visible malignancy upper chest and neck w/ lymphadenopathy and 3-4 cm  L mediastinal mass.  Baseline dyspnea likely caused by metastatic disease  Records requested from [place] 6/4 - not received yet  Hx stroke/TIA  06/1790 -embolic right MCA infarct with resultant short-term memory deficits and imbalance, TEE neg, loop recorder placed, put on aspirin and statin  Fever  T-max 102.4  Leukocytosis, likely reactive 14.2   UA negative  Urine culture no growth  Blood culture no growth for 3 days  CXR 03/28/19 persistent fullness in the left hilum due to  cancer  Hypotension  BP 65/41 this am  Tachycardia  HR   Could be related to pain at back at the metastasis site as per husband  Blood pressure   Home meds: None . SBP goal < 160 prior to comfort care given hemorrhagic conversion  Hyperlipidemia  Home meds: Zocor 40  LDL 38, goal < 70  No statin given comfort care  Dysphagia  Secondary to stroke  Failed swallow  Other Stroke Risk Factors  Advanced age  History ETOH use  Family hx stroke (daughter)  Other Active Problems  Acute blood loss anemia post IR Hgb   Hypokalemia  Acute urinary retention, foley inserted  Hospital day # 7  Patient has been made full comfort care and is actively dying.  She is resting on morphine drip.  Patient's husband is at the bedside.  His questions were answered.  Antony Contras, MD  To contact Stroke Continuity provider, please refer to [email or web address] After hours, contact General Neurology

## 2019-04-24 DEATH — deceased

## 2019-12-16 IMAGING — CT CT HEAD WITHOUT CONTRAST
4 series · 16 of 47 positions shown, 18 images · non-contrast
Comparison: MR brain 03/26/2019.

CLINICAL DATA: Continued surveillance of stroke.

EXAM:
CT HEAD WITHOUT CONTRAST
TECHNIQUE: Contiguous axial images were obtained from the base of the skull
through the vertex without intravenous contrast.

[Series 3: head wo · axial · 0.42mm/px · z∈[-88,+32]mm · 7 of 33 slices shown, 9 images]
[im 5/33  brain]
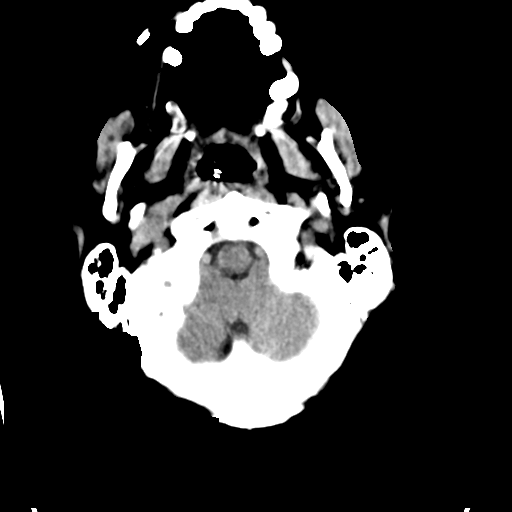
[im 5/33  bone]
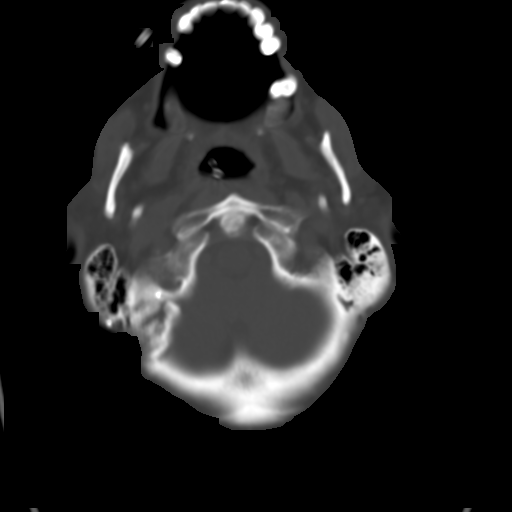
[im 9/33  brain]
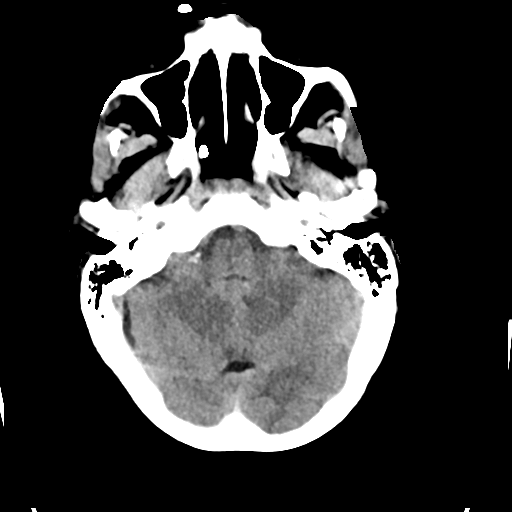
[im 13/33  brain]
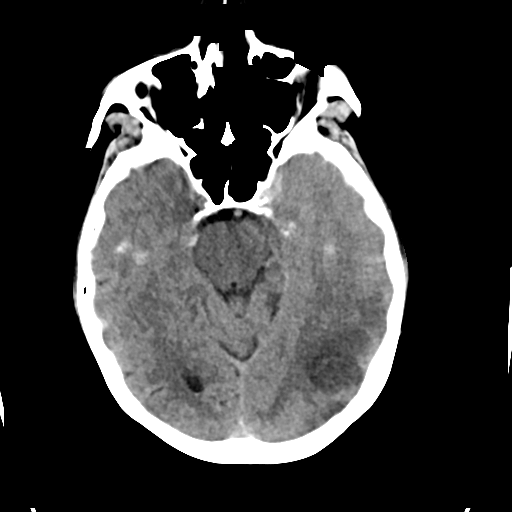
[im 17/33  brain]
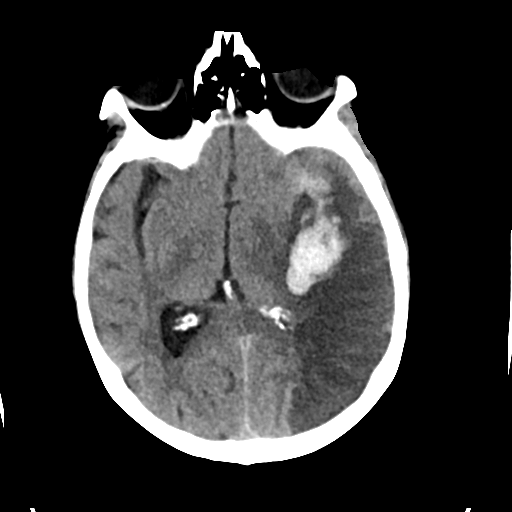
[im 21/33  brain]
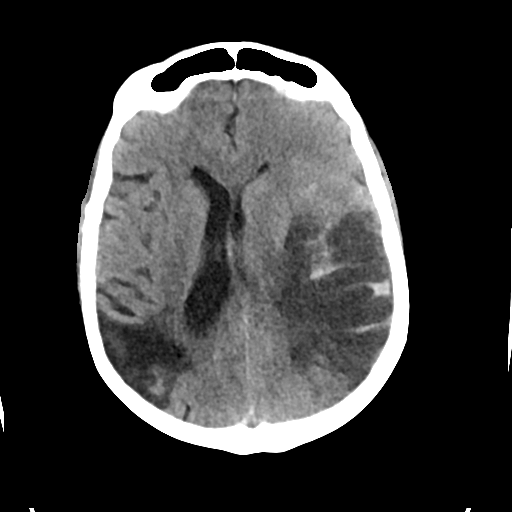
[im 21/33  bone]
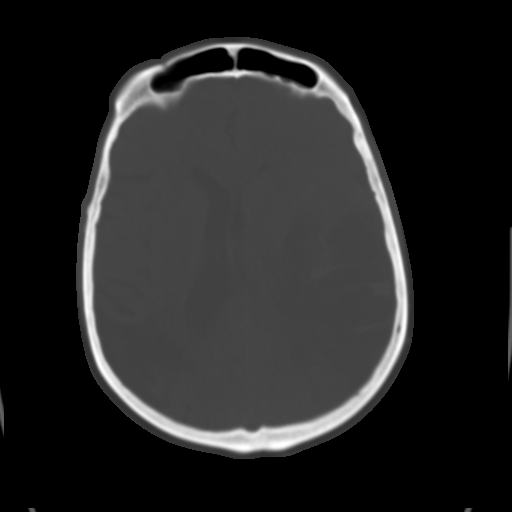
[im 25/33  brain]
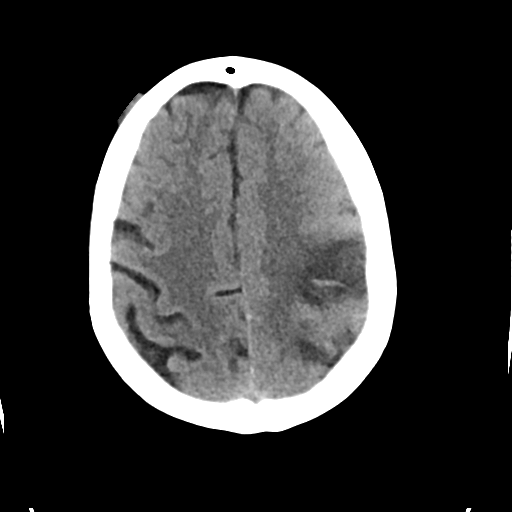
[im 29/33  brain]
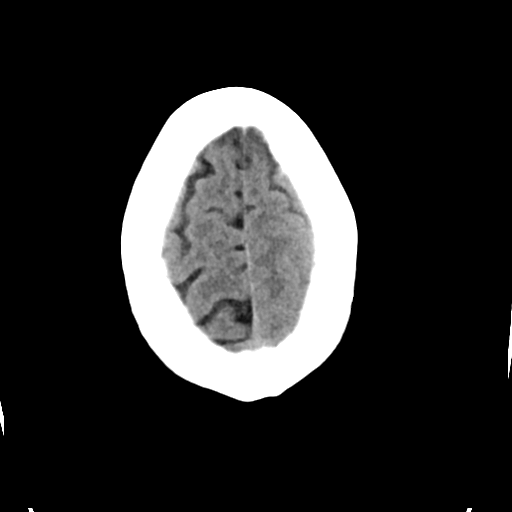

[Series 4: head bone · axial · 0.42mm/px · z∈[-92,-60]mm · 3 of 81 slices shown]
[im 9/81  bone]
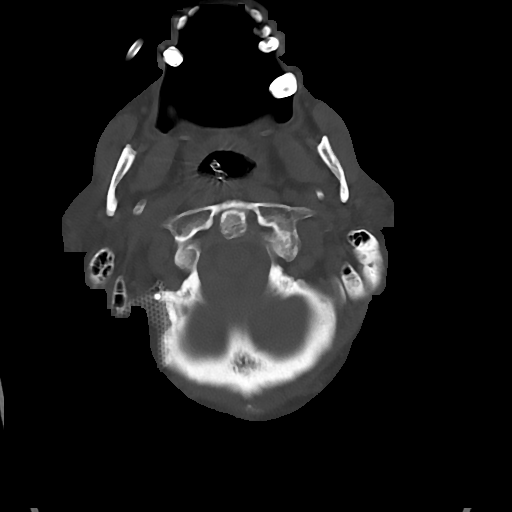
[im 17/81  bone]
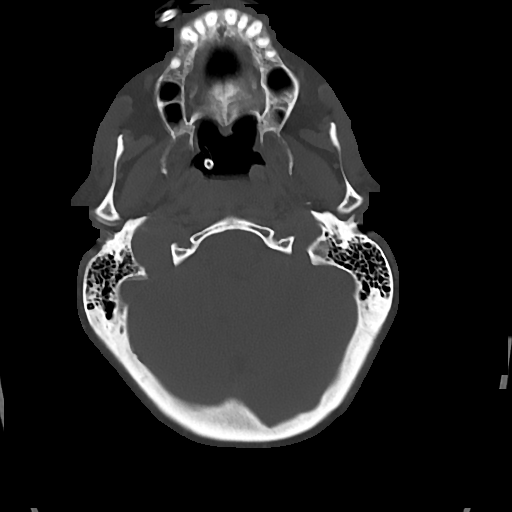
[im 25/81  bone]
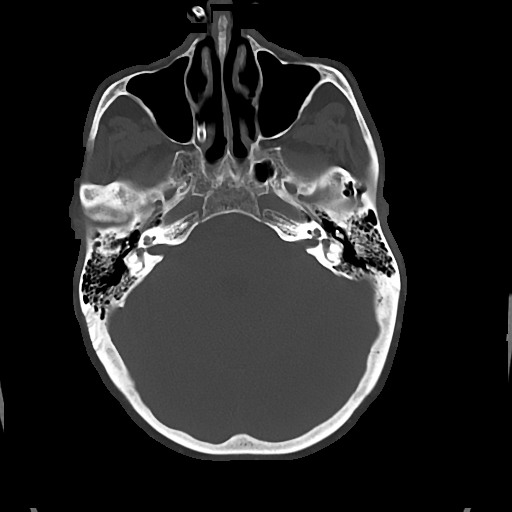

[Series 5: cor soft · coronal · 0.35mm/px · 3 of 68 slices shown]
[im 23/68  brain]
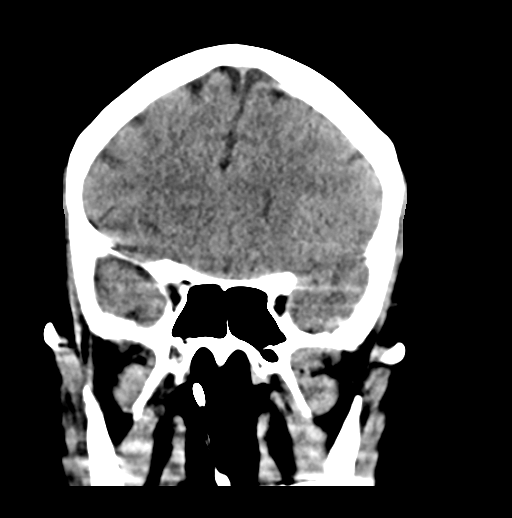
[im 30/68  brain]
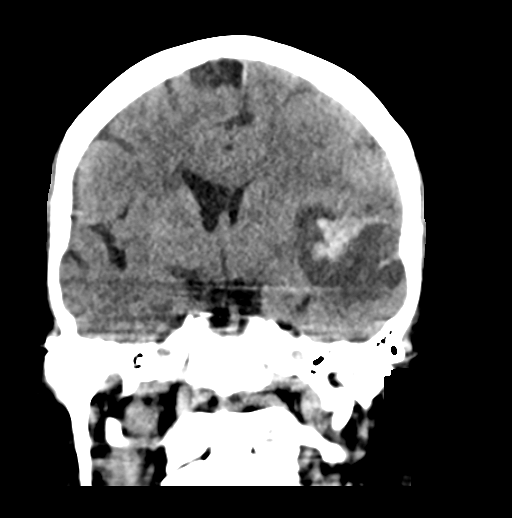
[im 38/68  brain]
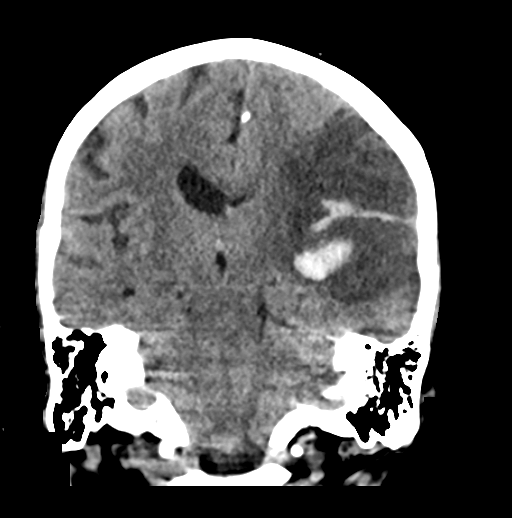

[Series 6: sag soft · sagittal · 0.35mm/px · 3 of 51 slices shown]
[im 17/51  brain]
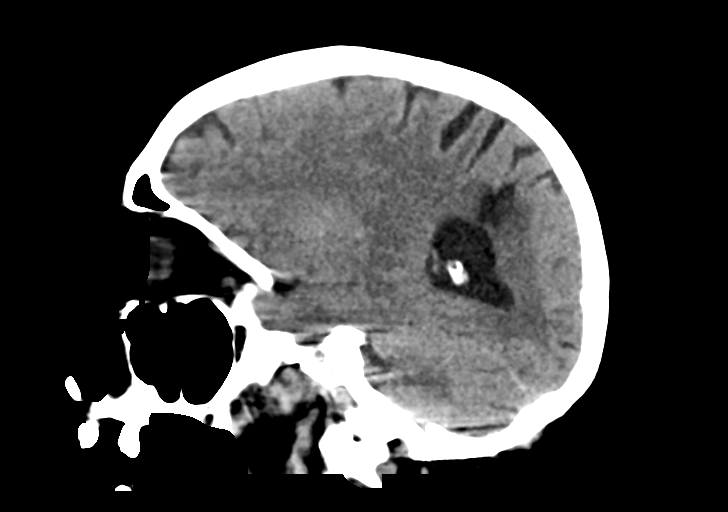
[im 26/51  brain]
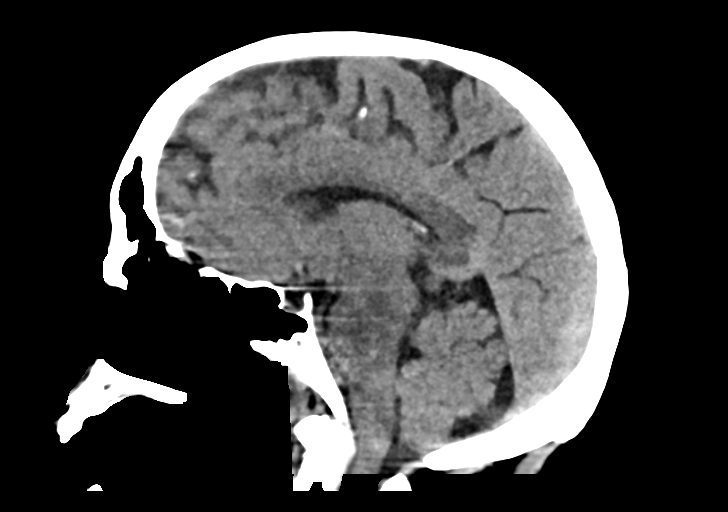
[im 34/51  brain]
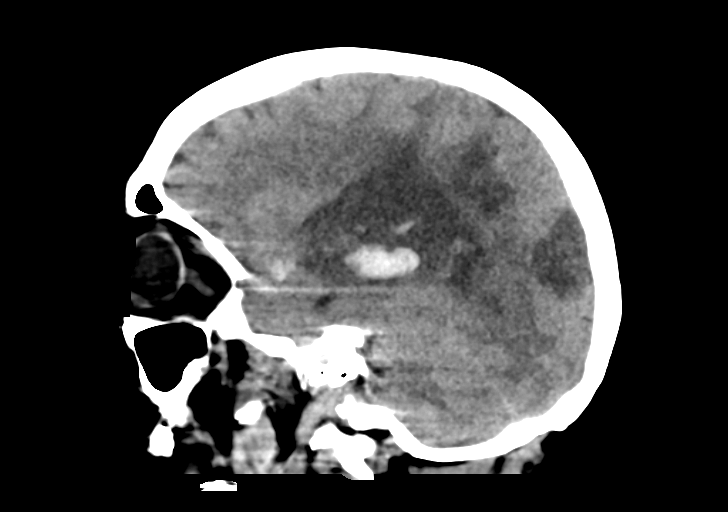

[16 of 47 positions shown; findings below may reference images not displayed]

FINDINGS: Brain: Well-defined low-attenuation Cytotoxic edema throughout the
infarcted LEFT hemisphere, involving basal ganglia, frontal,
temporal, parietal, and occipital lobes. Slight involution of
intracerebral hematoma, cross-section 24 x 42 mm on series 3, image
18. Resolving subarachnoid hemorrhage. LEFT-to-RIGHT shift mildly
increased, 5 mm.

Chronic RIGHT parietal infarct, with encephalomalacia.

No new areas of infarction.

Vascular: No hyperdense vessel or unexpected calcification.

Skull: Normal. Negative for fracture or focal lesion.

Sinuses/Orbits: No acute finding.

Other: None.
IMPRESSION: Expected evolutionary change of LEFT hemisphere infarct, complicated
by intracranial hemorrhage following reperfusion therapy. No new
findings.
